# Patient Record
Sex: Female | Born: 1975 | Race: White | Hispanic: No | Marital: Single | Smoking: Current every day smoker
Health system: Southern US, Community
[De-identification: ages and names within clinical notes are randomized; demographics above are authoritative.]

## PROBLEM LIST (undated history)

## (undated) DIAGNOSIS — Z789 Other specified health status: Secondary | ICD-10-CM

## (undated) HISTORY — PX: TUBAL LIGATION: SHX77

---

## 1998-04-16 ENCOUNTER — Emergency Department (HOSPITAL_COMMUNITY): Admission: EM | Admit: 1998-04-16 | Discharge: 1998-04-16 | Payer: Self-pay | Admitting: Emergency Medicine

## 1999-06-14 ENCOUNTER — Inpatient Hospital Stay (HOSPITAL_COMMUNITY): Admission: AD | Admit: 1999-06-14 | Discharge: 1999-06-14 | Payer: Self-pay | Admitting: Obstetrics & Gynecology

## 1999-06-14 ENCOUNTER — Encounter (INDEPENDENT_AMBULATORY_CARE_PROVIDER_SITE_OTHER): Payer: Self-pay

## 2000-07-02 ENCOUNTER — Ambulatory Visit (HOSPITAL_COMMUNITY): Admission: RE | Admit: 2000-07-02 | Discharge: 2000-07-02 | Payer: Self-pay | Admitting: *Deleted

## 2000-07-24 ENCOUNTER — Encounter: Admission: RE | Admit: 2000-07-24 | Discharge: 2000-07-24 | Payer: Self-pay | Admitting: Obstetrics

## 2000-08-12 ENCOUNTER — Encounter: Payer: Self-pay | Admitting: *Deleted

## 2000-08-12 ENCOUNTER — Encounter (HOSPITAL_COMMUNITY): Admission: RE | Admit: 2000-08-12 | Discharge: 2000-09-11 | Payer: Self-pay | Admitting: *Deleted

## 2000-08-13 ENCOUNTER — Encounter: Admission: RE | Admit: 2000-08-13 | Discharge: 2000-08-13 | Payer: Self-pay | Admitting: Obstetrics & Gynecology

## 2000-09-10 ENCOUNTER — Inpatient Hospital Stay (HOSPITAL_COMMUNITY): Admission: AD | Admit: 2000-09-10 | Discharge: 2000-09-13 | Payer: Self-pay | Admitting: *Deleted

## 2000-09-10 ENCOUNTER — Encounter (INDEPENDENT_AMBULATORY_CARE_PROVIDER_SITE_OTHER): Payer: Self-pay

## 2000-10-14 ENCOUNTER — Encounter: Admission: RE | Admit: 2000-10-14 | Discharge: 2000-10-14 | Payer: Self-pay | Admitting: Sports Medicine

## 2000-10-14 ENCOUNTER — Inpatient Hospital Stay (HOSPITAL_COMMUNITY): Admission: EM | Admit: 2000-10-14 | Discharge: 2000-10-17 | Payer: Self-pay | Admitting: *Deleted

## 2000-10-31 ENCOUNTER — Encounter: Admission: RE | Admit: 2000-10-31 | Discharge: 2000-10-31 | Payer: Self-pay | Admitting: Family Medicine

## 2000-11-18 ENCOUNTER — Encounter: Admission: RE | Admit: 2000-11-18 | Discharge: 2000-11-18 | Payer: Self-pay | Admitting: Family Medicine

## 2001-02-24 ENCOUNTER — Inpatient Hospital Stay (HOSPITAL_COMMUNITY): Admission: AD | Admit: 2001-02-24 | Discharge: 2001-02-24 | Payer: Self-pay | Admitting: Obstetrics

## 2001-02-24 ENCOUNTER — Encounter: Payer: Self-pay | Admitting: Obstetrics

## 2001-06-13 ENCOUNTER — Emergency Department (HOSPITAL_COMMUNITY): Admission: EM | Admit: 2001-06-13 | Discharge: 2001-06-14 | Payer: Self-pay | Admitting: Emergency Medicine

## 2001-07-22 ENCOUNTER — Other Ambulatory Visit: Admission: RE | Admit: 2001-07-22 | Discharge: 2001-07-22 | Payer: Self-pay | Admitting: *Deleted

## 2001-07-23 ENCOUNTER — Encounter: Admission: RE | Admit: 2001-07-23 | Discharge: 2001-07-23 | Payer: Self-pay | Admitting: Family Medicine

## 2001-07-23 ENCOUNTER — Encounter (INDEPENDENT_AMBULATORY_CARE_PROVIDER_SITE_OTHER): Payer: Self-pay | Admitting: *Deleted

## 2001-07-23 ENCOUNTER — Other Ambulatory Visit: Admission: RE | Admit: 2001-07-23 | Discharge: 2001-07-23 | Payer: Self-pay | Admitting: Obstetrics

## 2001-10-20 ENCOUNTER — Encounter: Admission: RE | Admit: 2001-10-20 | Discharge: 2001-10-20 | Payer: Self-pay | Admitting: Family Medicine

## 2002-11-11 ENCOUNTER — Emergency Department (HOSPITAL_COMMUNITY): Admission: EM | Admit: 2002-11-11 | Discharge: 2002-11-11 | Payer: Self-pay | Admitting: *Deleted

## 2003-11-17 ENCOUNTER — Emergency Department (HOSPITAL_COMMUNITY): Admission: AD | Admit: 2003-11-17 | Discharge: 2003-11-17 | Payer: Self-pay | Admitting: Emergency Medicine

## 2005-04-15 ENCOUNTER — Ambulatory Visit (HOSPITAL_COMMUNITY): Admission: RE | Admit: 2005-04-15 | Discharge: 2005-04-15 | Payer: Self-pay | Admitting: Obstetrics and Gynecology

## 2005-04-26 ENCOUNTER — Encounter (HOSPITAL_COMMUNITY): Admission: AD | Admit: 2005-04-26 | Discharge: 2005-05-26 | Payer: Self-pay | Admitting: Obstetrics and Gynecology

## 2005-05-03 ENCOUNTER — Inpatient Hospital Stay (HOSPITAL_COMMUNITY): Admission: AD | Admit: 2005-05-03 | Discharge: 2005-05-03 | Payer: Self-pay | Admitting: *Deleted

## 2005-07-11 ENCOUNTER — Inpatient Hospital Stay (HOSPITAL_COMMUNITY): Admission: AD | Admit: 2005-07-11 | Discharge: 2005-07-11 | Payer: Self-pay

## 2005-07-12 ENCOUNTER — Inpatient Hospital Stay (HOSPITAL_COMMUNITY): Admission: AD | Admit: 2005-07-12 | Discharge: 2005-07-14 | Payer: Self-pay | Admitting: Obstetrics and Gynecology

## 2005-08-22 ENCOUNTER — Other Ambulatory Visit: Admission: RE | Admit: 2005-08-22 | Discharge: 2005-08-22 | Payer: Self-pay | Admitting: Obstetrics and Gynecology

## 2005-10-01 ENCOUNTER — Ambulatory Visit (HOSPITAL_COMMUNITY): Admission: RE | Admit: 2005-10-01 | Discharge: 2005-10-01 | Payer: Self-pay | Admitting: Obstetrics and Gynecology

## 2007-11-05 ENCOUNTER — Emergency Department (HOSPITAL_COMMUNITY): Admission: EM | Admit: 2007-11-05 | Discharge: 2007-11-05 | Payer: Self-pay | Admitting: Emergency Medicine

## 2007-11-08 ENCOUNTER — Emergency Department (HOSPITAL_COMMUNITY): Admission: EM | Admit: 2007-11-08 | Discharge: 2007-11-08 | Payer: Self-pay | Admitting: *Deleted

## 2007-12-07 ENCOUNTER — Emergency Department (HOSPITAL_COMMUNITY): Admission: EM | Admit: 2007-12-07 | Discharge: 2007-12-07 | Payer: Self-pay | Admitting: Emergency Medicine

## 2008-01-02 ENCOUNTER — Emergency Department (HOSPITAL_COMMUNITY): Admission: EM | Admit: 2008-01-02 | Discharge: 2008-01-02 | Payer: Self-pay | Admitting: Emergency Medicine

## 2008-01-28 ENCOUNTER — Emergency Department (HOSPITAL_COMMUNITY): Admission: EM | Admit: 2008-01-28 | Discharge: 2008-01-28 | Payer: Self-pay | Admitting: Emergency Medicine

## 2008-03-28 ENCOUNTER — Emergency Department (HOSPITAL_COMMUNITY): Admission: EM | Admit: 2008-03-28 | Discharge: 2008-03-29 | Payer: Self-pay | Admitting: Emergency Medicine

## 2008-05-03 ENCOUNTER — Emergency Department (HOSPITAL_COMMUNITY): Admission: EM | Admit: 2008-05-03 | Discharge: 2008-05-03 | Payer: Self-pay | Admitting: Emergency Medicine

## 2010-01-17 ENCOUNTER — Emergency Department (HOSPITAL_COMMUNITY): Admission: EM | Admit: 2010-01-17 | Discharge: 2010-01-17 | Payer: Self-pay | Admitting: Emergency Medicine

## 2010-04-10 ENCOUNTER — Emergency Department (HOSPITAL_COMMUNITY): Admission: EM | Admit: 2010-04-10 | Discharge: 2010-04-10 | Payer: Self-pay | Admitting: Emergency Medicine

## 2011-04-19 NOTE — Discharge Summary (Signed)
NAMELAFONDA, PATRON          ACCOUNT NO.:  [id number]   MEDICAL RECORD NO.:  [id number]          PATIENT TYPE:  INP   LOCATION:  9134                          FACILITY:  [facility]   PHYSICIAN:  Zenaida Niece, M.D.DATE OF BIRTH:  [birth year]   DATE OF ADMISSION:  07/12/2005  DATE OF DISCHARGE:  07/14/2005                                 DISCHARGE SUMMARY   ADMISSION DIAGNOSIS:  Intrauterine pregnancy at 38 weeks and meconium-  stained amniotic fluid.   DISCHARGE DIAGNOSIS:  Intrauterine pregnancy at 38 weeks and meconium-  stained fluid.   PROCEDURE:  On July 12, 2005, she had a spontaneous vaginal delivery.   HISTORY AND PHYSICAL:  This is a 35 year old white female gravida 6, para 1-  0-4-1 with an EGA of [redacted] weeks who presents with complaint of regular  contractions. Cervical exam on August10, 2006 was 4, 60%, and -1 and on the  day of admission in the office she was 6, 80% and -1. 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 Prenatal labs blood type is O  negative with negative antibody screen, RPR nonreactive, rubella equivocal,  hepatitis B surface antigen negative, HIV negative, Gonorrhea and chlamydia  negative, triple screen is normal, 1-hour Glucola 136, group B strep is  negative.   PAST OB HISTORY:  Three elective terminations, one spontaneous abortion, and  in 2001 vaginal delivery at 39 weeks, 7 pounds, no complications.   PAST MEDICAL HISTORY:  History of bipolar disorder.   PHYSICAL EXAMINATION:  VITAL SIGNS:  She is afebrile with stable vital  signs. Fetal heart tracing reactive with contractions every 45 minutes.  ABDOMEN:  Gravid, nontender with an estimated fetal weight 7-1/2 pounds.  PELVIC:  Cervix was in the hospital was 6-7, 80%, -1 vertex presentation and  amniotomy reveals thick meconium.   HOSPITAL COURSE:  The patient was admitted and she continued to  labor on her  own. Amniotomy was performed for augmentation. She received an epidural. She  progressed to complete, pushed well and on the evening of August11, 2006 had  a vaginal delivery of a viable female infant with Apgar's of 8 and 9, weight 6  pounds 15 ounces in the ROA position. DeLee and bulb suction were performed  on the perineum with moderate to thick meconium. Dr. Mikle Bosworth and the NICU  team were at delivery. The baby did cry spontaneously. Placenta delivered  spontaneously and was intact. Perineum was intact and estimated blood loss  was less than 500 cc. Postpartum, the patient had no significant  complications. Pre delivery hemoglobin 12.3, postdelivery 10.4. On  postpartum day #2, she was stable for discharge home.   DISCHARGE INSTRUCTIONS:  Regular diet, pelvic rest. Follow up in 4 weeks to  arrange tubal ligation.   MEDICATIONS:  1.  Percocet #20 one to two p.o. q.4-6h. p.r.n. pain.  2.  Over-the-counter ibuprofen as needed. She is also given our discharge      pamphlet.      Zenaida Niece, M.D.  Electronically Signed  TDM/MEDQ  D:  07/14/2005  T:  07/14/2005  Job:  [id number]

## 2011-04-19 NOTE — H&P (Signed)
[name], [name]          ACCOUNT NO.:  [id number]   MEDICAL RECORD NO.:  [id number]          PATIENT TYPE:  AMB   LOCATION:  SDC                           [facility]ACILITY:  [facility]   PHYSICIAN:  Zenaida Niece, M.D.DATE OF BIRTH:  Apr 23, 1976   DATE OF ADMISSION:  10/01/2005  DATE OF DISCHARGE:                                HISTORY & PHYSICAL   CHIEF COMPLAINT:  Desires surgical sterility.   HISTORY OF PRESENT ILLNESS:  This is a 35 year old white female, now para 2-  0-4-2, who is status post a vaginal delivery on July 12, 2005.  She had an  uncomplicated course after pregnancy, and does wish to proceed with surgical  sterilization.  All options and risks have been discussed with the patient,  and she wishes to proceed.   PAST OBSTETRIC HISTORY:  Significant for 3 elective terminations, 1  spontaneous abortion, and 2 vaginal deliveries at term without  complications.   PAST MEDICAL HISTORY:  History of bipolar disorder.   PAST SURGICAL HISTORY:  Negative.   ALLERGIES:  None.   CURRENT MEDICATIONS:  None.   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   FAMILY HISTORY:  Noncontributory.   REVIEW OF SYSTEMS:  Negative.   PHYSICAL EXAMINATION:  GENERAL:  This is a well-developed white female in no  acute distress.  VITAL SIGNS:  Weight is approximately 152 pounds.  NECK:  Supple without lymphadenopathy or thyromegaly.  LUNGS:  Clear to auscultation.  HEART:  Regular rate and rhythm without murmur.  ABDOMEN:  Soft, nontender, nondistended, without palpable masses.  BREASTS:  She did have a 1 cm nodule below the left areola at 9 o'clock, and  she has seen Dr. Lindie Spruce for this.  EXTREMITIES:  No edema and are nontender.  PELVIC:  External genitalia is within normal limits.  Speculum exam reveals  a normal cervix, and on colposcopy she had acetowhite changes anteriorly.  Bimanual exam reveals a small midplane nontender uterus  without palpable  masses.   ASSESSMENT:  Desires surgical sterility.  Again, all options and risks have  been discussed with the patient.  She understands this is a permanent  procedure with a 1 in 150 failure rate.  She understands there are other  options.  She  understands the risks of surgery including bleeding, infection, and damage  to surrounding organs including the bowel, the bladder, and the ureters.  She understands these risks and wishes to proceed.   PLAN:  The plan is to admit the patient on the day of surgery with  laparoscopy with bilateral tubal fulguration.      Zenaida Niece, M.D.  Electronically Signed     TDM/MEDQ  D:  09/30/2005  T:  09/30/2005  Job:  [id number]
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
## 2011-04-19 NOTE — Op Note (Signed)
[name], [name]          ACCOUNT NO.:  [id number]   MEDICAL RECORD NO.:  [id number]          PATIENT TYPE:  AMB   LOCATION:  SDC                           FACILITY:  [facility]   PHYSICIAN:  Zenaida Niece, M.D.DATE OF BIRTH:  1976-10-21   DATE OF PROCEDURE:  10/01/2005  DATE OF DISCHARGE:                                 OPERATIVE REPORT   PREOPERATIVE DIAGNOSIS:  Desires surgical sterility.   POSTOPERATIVE DIAGNOSES:  1.  Desires surgical sterility.  2.  Pelvic adhesions.   PROCEDURE:  Laparoscopy with bilateral tubal fulguration.   SURGEON:  Zenaida Niece, M.D.   ANESTHESIA:  General endotracheal tube.   SPECIMENS:  None.   ESTIMATED BLOOD LOSS:  Minimal.   COMPLICATIONS:  None.   FINDINGS:  Normal uterus, tubes and ovaries.  There were some adhesions of  the sigmoid to the left pelvis.   PROCEDURE IN DETAIL:  The patient was taken to the operating room and placed  in the dorsal supine position.  General anesthesia was induced and she was  placed in mobile stirrups.  Abdomen was prepped and draped in the usual  sterile fashion, bladder drained with a red rubber catheter, Hulka tenaculum  applied to the cervix for uterine manipulation.  Infraumbilical skin was  then infiltrated with 0.25% Marcaine and a 1.5 cm horizontal incision was  made.  The Veress needle was inserted into the peritoneal cavity and  placement confirmed by the water drop test and an opening pressure of 3  mmHg.  CO2 gas was insufflated to a pressure of 12 mmHg and the Veress  needle was removed.  A size 11 disposable trocar was then introduced and  placement confirmed by the laparoscope.  Inspection of the abdomen revealed  a normal liver, normal stomach and normal bowel.  The uterus, tubes and  ovaries appeared normal.  There were sigmoid adhesions to the left pelvis, and some of these were  taken down bluntly.  Both fallopian tubes in all spots.  This achieved good  fulguration with  adequate hemostasis.  The laparoscope was then removed and  all gas allowed to deflate from the abdomen.  The trocar was then removed.  The skin was closed with interrupted  subcuticular sutures of 3-0 Vicryl followed by Dermabond.  The Hulka  tenaculum was then removed from the cervix.  The patient was awakened in the  operating room after tolerating the procedure well and was taken to the  recovery room in stable condition.  At the end, counts were correct x2 and  she received no antibiotics.      Zenaida Niece, M.D.  Electronically Signed     TDM/MEDQ  D:  10/01/2005  T:  10/01/2005  Job:  [id number]

## 2011-04-19 NOTE — Discharge Summary (Signed)
[facility]  Patient:    [name], [name]                 MRN: [id number] Adm. Date:  [id number] Disc. Date: [id number] Attending:  Denny Peon                           Discharge Summary  INTRODUCTION:  [name] is a 35 year old white single female mother of a one-month-old daughter.  The patient was admitted on a voluntary basis after developing suicidal thoughts with plan to cut her wrists.  For several weeks she could not sleep, felt depressed hopeless, helpless and she believes and has a good reason to believe it, the boyfriend, who is the father of her baby, cheats on her.  The patient complained of tiredness, anhedonia and irritability.  She denied hallucinations, denied homicidal thoughts, but complained of having racing thoughts.  The patient does not have a previous history of depression.  The patient did not have any medical problems at the time of admission but had some vaginal bleeding, which could be the beginning of her first period.  She had one week ago unprotected intercourse with her boyfriend but denied feeling pregnant.  Details of patients history are available on the chart.  Initial impression was major depression, single episode, moderate with no diagnosis on axes II or III.  HOSPITAL COURSE:  After admission to the ward, the patient was placed on special observation.  I ordered blood work to recheck pregnancy test.  I started the patient on Celexa 20 mg daily and Ativan 0.25 mg as needed for anxiety, trazodone 25 mg for insomnia.  On October 16, 2000, the patient was doing much better, denied suicidal thoughts and slept better with trazodone.  Her affect was improved, brighter, Judgment was still questionable.  I asked her to establish some contact with family and boyfriend in order to prepare patient for discharge.  The patients boyfriend did not attend session on October 16, 2000 but boyfriend cancelled  session due to oversleeping.  He cancelled session on October 17, 2000 because his car broke down.  The patient stated that nevertheless she wanted to be discharged.  She denied any suicidal ideation.  During the examination she presented with bright affect, strongly denied suicidal thoughts or thoughts to hurt her boyfriend.  She felt that group attendance and medication helped her.  The patient was still hoping that she can accomplish some relationship with her boyfriend on an outpatient basis and felt she got strong enough to deal with her current difficulties.  Caseworker, Tereasa Coop, was able to talk to boyfriend who felt his girlfriend, after telephone contact with her, was doing better and was ready for discharge.  The patients boyfriend agreed to couple counseling and it was also favorably seen by the patient.  It was a team feeling, shared by other psychiatrists that the patient was ready for discharge and was discharged home in the care of her boyfriend.  MEDICAL PROBLEMS:  During the hospital course the patient did not have any ongoing medical problems.  Vital signs were stable. The patient was afebrile with normal blood pressure.  The patient did not cooperate with order for blood work, feeling she had blood work done at the time of her childbirth and delivery.  DISCHARGE DIAGNOSES: Axis I:    Major depression, single episode, moderate. Axis II:   No diagnosis. Axis III:  Status postpartum one month.  Axis IV:   Moderate stressor, problem with boyfriend. Axis V:    Global assessment of functioning upon admission 30, maximum for            past year 70, upon discharge 55-60.  Prior to admission the patient already started her own birth control pills started by her gynecologist.  This was another reason she did not cooperate with testing, telling it was impossible she could be pregnant.  DISCHARGE RECOMMENDATIONS: 1. The patient received prescription for Celexa to  take 20 mg daily, trazodone    50 mg to take 1.2 tablet as needed for insomnia. 2. She is to call mental health for recurrence of suicidal thoughts. 3. She should attend with her boyfriend couple counseling at [facility].  She has an appointment at [facility] with Dr. Harle Stanford on October 29, 2000 at 1:30 p.m. 4. The patient understood instructions and side effects of medication were    explained to her. DD:  11/18/00 TD:  11/19/00 Job: [id number]

## 2011-08-21 LAB — URINALYSIS, ROUTINE W REFLEX MICROSCOPIC
Ketones, ur: NEGATIVE
Nitrite: NEGATIVE
Protein, ur: NEGATIVE
Specific Gravity, Urine: 1.026
Urobilinogen, UA: 0.2

## 2011-08-21 LAB — DIFFERENTIAL
Basophils Relative: 0
Eosinophils Absolute: 0.3
Eosinophils Relative: 3
Monocytes Relative: 8

## 2011-08-21 LAB — I-STAT 8, (EC8 V) (CONVERTED LAB)
Bicarbonate: 23.1
Hemoglobin: 15.6 — ABNORMAL HIGH
TCO2: 24
pCO2, Ven: 33.9 — ABNORMAL LOW
pH, Ven: 7.441 — ABNORMAL HIGH

## 2011-08-21 LAB — URINE MICROSCOPIC-ADD ON

## 2011-08-21 LAB — POCT I-STAT CREATININE
Creatinine, Ser: 0.9
Operator id: 288831

## 2011-08-21 LAB — CBC
HCT: 43
Hemoglobin: 14.6
MCHC: 34
MCV: 85.5
Platelets: 253
RDW: 12.9

## 2011-08-27 LAB — CBC
Hemoglobin: 13.2
MCHC: 34.9
MCV: 86.2
RBC: 4.38
RDW: 12.6

## 2011-08-27 LAB — BASIC METABOLIC PANEL
BUN: 11
Creatinine, Ser: 0.81
GFR calc Af Amer: >60
GFR calc non Af Amer: >60
Glucose, Bld: 99
Potassium: 3.3 — ABNORMAL LOW
Sodium: 138

## 2011-08-27 LAB — DIFFERENTIAL
Band Neutrophils: 0
Blasts: 0
Eosinophils Absolute: 0.1
Lymphocytes Relative: 27
Metamyelocytes Relative: 0
Monocytes Relative: 6
Myelocytes: 0
Neutro Abs: 7.4
nRBC: 0

## 2011-08-27 LAB — URINE MICROSCOPIC-ADD ON

## 2011-08-27 LAB — URINALYSIS, ROUTINE W REFLEX MICROSCOPIC
Glucose, UA: NEGATIVE
Protein, ur: 30 — AB
Specific Gravity, Urine: 1.033 — ABNORMAL HIGH
Urobilinogen, UA: 1

## 2011-08-27 LAB — GC/CHLAMYDIA PROBE AMP, GENITAL
Chlamydia, DNA Probe: NEGATIVE
GC Probe Amp, Genital: NEGATIVE

## 2011-08-27 LAB — WET PREP, GENITAL
Trich, Wet Prep: NONE SEEN
Yeast Wet Prep HPF POC: NONE SEEN

## 2013-05-21 ENCOUNTER — Encounter (HOSPITAL_COMMUNITY): Payer: Self-pay | Admitting: *Deleted

## 2013-05-21 ENCOUNTER — Emergency Department (INDEPENDENT_AMBULATORY_CARE_PROVIDER_SITE_OTHER)
Admission: EM | Admit: 2013-05-21 | Discharge: 2013-05-21 | Disposition: A | Discharge status: Home / Self Care | Payer: Self-pay | Source: Home / Self Care

## 2013-05-21 DIAGNOSIS — IMO0002 Reserved for concepts with insufficient information to code with codable children: Secondary | ICD-10-CM

## 2013-05-21 DIAGNOSIS — N390 Urinary tract infection, site not specified: Secondary | ICD-10-CM

## 2013-05-21 DIAGNOSIS — M545 Low back pain: Secondary | ICD-10-CM

## 2013-05-21 DIAGNOSIS — S76219A Strain of adductor muscle, fascia and tendon of unspecified thigh, initial encounter: Secondary | ICD-10-CM

## 2013-05-21 DIAGNOSIS — M549 Dorsalgia, unspecified: Secondary | ICD-10-CM

## 2013-05-21 LAB — POCT URINALYSIS DIP (DEVICE)
Protein, ur: >=300 mg/dL — AB
Urobilinogen, UA: 1 mg/dL (ref 0.0–1.0)
pH: 5.5 (ref 5.0–8.0)

## 2013-05-21 MED ORDER — CEPHALEXIN 250 MG PO CAPS: 250.0000 mg | ORAL_CAPSULE | Freq: Four times a day (QID) | ORAL | Status: DC

## 2013-05-21 NOTE — ED Provider Notes (Signed)
History     CSN: [id number]  Arrival date & time 05/21/13  1046   None     Chief Complaint  Patient presents with  . Urinary Frequency    (Consider location/radiation/quality/duration/timing/severity/associated sxs/prior treatment) HPI Comments: 37 year old female complaining of pain in the left low back radiating to the left inguinal pus was 2 days ago. She is also complaining of urinary frequency and dysuria. She states that after she has a full full weight and she experiences pain in the left inguinal/lower pelvis it radiates into the left low back and into the thoracic or lumbar musculature. The pain is worse with movement such as bending, twisting, pulling, turning. Her job entails frequent walking sometimes rapid walking at her job for several hours during the day. Otherwise she does not exercise or use machinery for exercise purposes.   History reviewed. No pertinent past medical history.  History reviewed. No pertinent past surgical history.  History reviewed. No pertinent family history.  History  Substance Use Topics  . Smoking status: Current Every Day Smoker  . Smokeless tobacco: Not on file  . Alcohol Use: Yes    OB History   Grav Para Term Preterm Abortions TAB SAB Ect Mult Living                  Review of Systems  Constitutional: Negative.   HENT: Negative.   Respiratory: Negative.   Cardiovascular: Negative.   Gastrointestinal: Negative for nausea, vomiting, abdominal pain and blood in stool.  Genitourinary: Positive for dysuria, urgency, frequency, flank pain and vaginal bleeding. Negative for decreased urine volume, vaginal discharge and vaginal pain.  Musculoskeletal: Positive for myalgias and back pain.  Skin: Negative.   Neurological: Negative.   Hematological: Does not bruise/bleed easily.  Psychiatric/Behavioral: The patient is nervous/anxious.     Allergies  Review of patient's allergies indicates no known allergies.  Home Medications    Current Outpatient Rx  Name  Route  Sig  Dispense  Refill  . cephALEXin (KEFLEX) 250 MG capsule   Oral   Take 1 capsule (250 mg total) by mouth 4 (four) times daily.   28 capsule   0     BP 122/80  Pulse 72  Temp(Src) 99.2 F (37.3 C) (Oral)  Resp 16  SpO2 100%  LMP 05/21/2013  Physical Exam  Nursing note and vitals reviewed. Constitutional: She is oriented to person, place, and time. She appears well-developed and well-nourished. No distress.  Eyes: Conjunctivae and EOM are normal.  Neck: Normal range of motion. Neck supple.  Cardiovascular: Normal rate, regular rhythm and normal heart sounds.   Pulmonary/Chest: Effort normal and breath sounds normal.  Abdominal: Soft. She exhibits no distension and no mass. There is no rebound and no guarding.  Light, left lateral abdominal palpation elicits a pain response. There is tenderness along the abdominal wall into the left inguinal. Straight leg raises on the left reproduce the pain in the inguinal and lower pelvis/abdomen. Palpation of musculature and tendons that contract during this maneuver becomes easily palpable and produces pain with palpation. Mild tenderness over the suprapubic area.  Musculoskeletal: She exhibits no edema.  As above palpation of the left inguinal muscle and tendons are tender. Palpation of the muscles to the left the right lumbar area tender to light and moderate touch. Having her flex forward and then arrives to an erect position produces pain in this area. Is also tenderness in the upper left buttock muscle/gluteus medius and maximus. She states his  reproduces the pain for which she presents.  Neurological: She is alert and oriented to person, place, and time. No cranial nerve deficit. Coordination normal.  Skin: Skin is warm and dry. No rash noted.  Psychiatric: Her mood appears anxious.    ED Course  Procedures (including critical care time)  Labs Reviewed  POCT URINALYSIS DIP (DEVICE) - Abnormal;  Notable for the following:    Bilirubin Urine SMALL (*)    Ketones, ur TRACE (*)    Hgb urine dipstick LARGE (*)    Protein, ur >=300 (*)    Leukocytes, UA TRACE (*)    All other components within normal limits  POCT PREGNANCY, URINE   No results found.   1. Thoracolumbar back pain   2. Groin strain, initial encounter   3. UTI (lower urinary tract infection)       MDM  Apply heat to the area of pain in the groin and left back. No lifting, bending, stooping, pulling or excessive walking. Out of work today and tomorrow. May take OTC ibuprofen as directed for pain relief. Keflex 250 mg 4 times a day for 7 days. Drink plenty of fluids especially water and stay well-hydrated. For any new symptoms problems or worsening may return. The patient was unable to offer a urine specimen sufficient enough to obtain a urine culture as she voided just prior to coming to the urgent care. She is currently having her menses.        Hayden Rasmussen, NP 05/21/13 1135

## 2013-05-21 NOTE — ED Notes (Signed)
Pt  Reports   Frequency  Urination            With    Pain  When  She  Finishes    She  Also reports   Pain l  Side  Radiating to l  Lower  Quadrant        Symptoms  X  2  Days        Pt  Reports  She  Just started  Her  Period  Today

## 2013-05-21 NOTE — ED Provider Notes (Signed)
Medical screening examination/treatment/procedure(s) were performed by resident physician or non-physician practitioner and as supervising physician I was immediately available for consultation/collaboration.   KINDL,JAMES DOUGLAS MD.   James D Kindl, MD 05/21/13 1232 

## 2014-01-07 ENCOUNTER — Encounter (HOSPITAL_COMMUNITY): Payer: Self-pay | Admitting: Emergency Medicine

## 2014-01-07 ENCOUNTER — Emergency Department (HOSPITAL_COMMUNITY)
Admission: EM | Admit: 2014-01-07 | Discharge: 2014-01-07 | Disposition: A | Discharge status: Home / Self Care | Payer: Self-pay | Attending: Emergency Medicine | Admitting: Emergency Medicine

## 2014-01-07 DIAGNOSIS — N6011 Diffuse cystic mastopathy of right breast: Secondary | ICD-10-CM

## 2014-01-07 DIAGNOSIS — N6012 Diffuse cystic mastopathy of left breast: Secondary | ICD-10-CM

## 2014-01-07 DIAGNOSIS — N6019 Diffuse cystic mastopathy of unspecified breast: Secondary | ICD-10-CM | POA: Insufficient documentation

## 2014-01-07 DIAGNOSIS — J029 Acute pharyngitis, unspecified: Secondary | ICD-10-CM

## 2014-01-07 DIAGNOSIS — J069 Acute upper respiratory infection, unspecified: Secondary | ICD-10-CM | POA: Insufficient documentation

## 2014-01-07 DIAGNOSIS — F17200 Nicotine dependence, unspecified, uncomplicated: Secondary | ICD-10-CM | POA: Insufficient documentation

## 2014-01-07 LAB — RAPID STREP SCREEN (MED CTR MEBANE ONLY): Streptococcus, Group A Screen (Direct): NEGATIVE

## 2014-01-07 MED ORDER — IBUPROFEN 400 MG PO TABS
800.0000 mg | ORAL_TABLET | Freq: Once | ORAL | Status: AC
  Administered 2014-01-07: 800 mg via ORAL
  Filled 2014-01-07: qty 2

## 2014-01-07 NOTE — ED Provider Notes (Signed)
CSN: [id number]     Arrival date & time 01/07/14  1505 History  This chart was scribed for [name], non-physician practitioner, working with Darlys Galesavid Masneri, MD by Bennett Scrapehristina Taylor, ED Scribe. This patient was seen in room TR10C/TR10C and the patient's care was started at 3:20 PM.    Chief Complaint  Patient presents with  . Sore Throat    The history is provided by the patient. No language interpreter was used.   HPI Comments: [name] is a 38 y.o. female who presents to the Emergency Department complaining of sore throat for the past couple of days. The pain is described as burning that is worse with drinking and eating. She reports an associated fever of 101 this morning. Temperature is 97.6 in the ED. She has taken Robitussin for the symptoms with improvement in the fever only. She reports several sick contacts at home with similar symptoms and she admits to having prior diagnoses of strep throat. She has a chronic smoker's cough but denies any changes.  She has a secondary complaint of a "knot" to her left breast that she noticed three days ago. She reports feeling a sharp pain when touching the area. She is currently on her menses which started this morning. She denies any recent heavy lifting.   History reviewed. No pertinent past medical history. History reviewed. No pertinent past surgical history. No family history on file. History  Substance Use Topics  . Smoking status: Current Every Day Smoker  . Smokeless tobacco: Not on file  . Alcohol Use: Yes  No OB history provided.  Review of Systems  A complete 10 system review of systems was obtained and all systems are negative except as noted in the HPI and PMH.   Allergies  Review of patient's allergies indicates no known allergies.  Home Medications   Current Outpatient Rx  Name  Route  Sig  Dispense  Refill  . guaiFENesin (ROBITUSSIN) 100 MG/5ML SOLN   Oral   Take 10 mLs by mouth daily as needed for cough  or to loosen phlegm.          Triage Vitals: BP 164/92  Pulse 74  Temp(Src) 97.6 F (36.4 C) (Oral)  Resp 16  SpO2 98%  Physical Exam  Nursing note and vitals reviewed. Constitutional: She is oriented to person, place, and time. She appears well-developed and well-nourished. No distress.  HENT:  Head: Normocephalic and atraumatic.  Enlarged and inflamed tonsils bilaterally+2 without exudate, mucosal edema  Eyes: EOM are normal.  Neck: Neck supple. No tracheal deviation present.  Cardiovascular: Normal rate.   Pulmonary/Chest: Effort normal. No respiratory distress.  1.5 cm tender, slightly raised, mobile mass to bilateral breasts around 12 o'clock, chaperone (scribe) present  Musculoskeletal: Normal range of motion.  Neurological: She is alert and oriented to person, place, and time.  Skin: Skin is warm and dry.  Psychiatric: She has a normal mood and affect. Her behavior is normal.    ED Course  Procedures (including critical care time)  DIAGNOSTIC STUDIES: Oxygen Saturation is 98% on RA, normal by my interpretation.    COORDINATION OF CARE: 3:26 PM-Informed pt that the "bumps" are most likely changes that come with menses. Discussed discharge plan which includes IBU for the breast pain. Will check a strep test.   Labs Review Labs Reviewed  RAPID STREP SCREEN  CULTURE, GROUP A STREP   Imaging Review No results found.  EKG Interpretation   None  MDM   1. Pharyngitis   2. Fibrocystic breast changes of both breasts   3. URI (upper respiratory infection)    Pt presenting with URI s/s, she is well appearing, NAD, afebrile, VSS. Rapid strep negative. Discussed fibrocystic changes in detail with pt, she will f/u with ob/gyn if no improvement. Advised ibuprofen. Symptomatic tx discussed. Stable for discharge. Return precautions given. Patient states understanding of treatment care plan and is agreeable.   I personally performed the services described in this  documentation, which was scribed in my presence. The recorded information has been reviewed and is accurate.    Trevor Mace, PA-C 01/07/14 [phone]

## 2014-01-07 NOTE — Discharge Instructions (Signed)
Take ibuprofen every 6-8 hours for pain, 600-800 mg. Follow up with your ob/gyn if symptoms do not improve. Rest, stay well hydrated.   Pharyngitis Pharyngitis is redness, pain, and swelling (inflammation) of your pharynx.  CAUSES  Pharyngitis is usually caused by infection. Most of the time, these infections are from viruses (viral) and are part of a cold. However, sometimes pharyngitis is caused by bacteria (bacterial). Pharyngitis can also be caused by allergies. Viral pharyngitis may be spread from person to person by coughing, sneezing, and personal items or utensils (cups, forks, spoons, toothbrushes). Bacterial pharyngitis may be spread from person to person by more intimate contact, such as kissing.  SIGNS AND SYMPTOMS  Symptoms of pharyngitis include:   Sore throat.   Tiredness (fatigue).   Low-grade fever.   Headache.  Joint pain and muscle aches.  Skin rashes.  Swollen lymph nodes.  Plaque-like film on throat or tonsils (often seen with bacterial pharyngitis). DIAGNOSIS  Your health care provider will ask you questions about your illness and your symptoms. Your medical history, along with a physical exam, is often all that is needed to diagnose pharyngitis. Sometimes, a rapid strep test is done. Other lab tests may also be done, depending on the suspected cause.  TREATMENT  Viral pharyngitis will usually get better in 3 4 days without the use of medicine. Bacterial pharyngitis is treated with medicines that kill germs (antibiotics).  HOME CARE INSTRUCTIONS   Drink enough water and fluids to keep your urine clear or pale yellow.   Only take over-the-counter or prescription medicines as directed by your health care provider:   If you are prescribed antibiotics, make sure you finish them even if you start to feel better.   Do not take aspirin.   Get lots of rest.   Gargle with 8 oz of salt water ( tsp of salt per 1 qt of water) as often as every 1 2 hours to  soothe your throat.   Throat lozenges (if you are not at risk for choking) or sprays may be used to soothe your throat. SEEK MEDICAL CARE IF:   You have large, tender lumps in your neck.  You have a rash.  You cough up green, yellow-brown, or bloody spit. SEEK IMMEDIATE MEDICAL CARE IF:   Your neck becomes stiff.  You drool or are unable to swallow liquids.  You vomit or are unable to keep medicines or liquids down.  You have severe pain that does not go away with the use of recommended medicines.  You have trouble breathing (not caused by a stuffy nose). MAKE SURE YOU:   Understand these instructions.  Will watch your condition.  Will get help right away if you are not doing well or get worse. Document Released: 11/18/2005 Document Revised: 09/08/2013 Document Reviewed: 07/26/2013 [facility] Patient Information 2014 [place], [place].  Upper Respiratory Infection, Adult An upper respiratory infection (URI) is also sometimes known as the common cold. The upper respiratory tract includes the nose, sinuses, throat, trachea, and bronchi. Bronchi are the airways leading to the lungs. Most people improve within 1 week, but symptoms can last up to 2 weeks. A residual cough may last even longer.  CAUSES Many different viruses can infect the tissues lining the upper respiratory tract. The tissues become irritated and inflamed and often become very moist. Mucus production is also common. A cold is contagious. You can easily spread the virus to others by oral contact. This includes kissing, sharing a glass, coughing, or  sneezing. Touching your mouth or nose and then touching a surface, which is then touched by another person, can also spread the virus. SYMPTOMS  Symptoms typically develop 1 to 3 days after you come in contact with a cold virus. Symptoms vary from person to person. They may include:  Runny nose.  Sneezing.  Nasal congestion.  Sinus irritation.  Sore throat.  Loss of  voice (laryngitis).  Cough.  Fatigue.  Muscle aches.  Loss of appetite.  Headache.  Low-grade fever. DIAGNOSIS  You might diagnose your own cold based on familiar symptoms, since most people get a cold 2 to 3 times a year. Your caregiver can confirm this based on your exam. Most importantly, your caregiver can check that your symptoms are not due to another disease such as strep throat, sinusitis, pneumonia, asthma, or epiglottitis. Blood tests, throat tests, and X-rays are not necessary to diagnose a common cold, but they may sometimes be helpful in excluding other more serious diseases. Your caregiver will decide if any further tests are required. RISKS AND COMPLICATIONS  You may be at risk for a more severe case of the common cold if you smoke cigarettes, have chronic heart disease (such as heart failure) or lung disease (such as asthma), or if you have a weakened immune system. The very young and very old are also at risk for more serious infections. Bacterial sinusitis, middle ear infections, and bacterial pneumonia can complicate the common cold. The common cold can worsen asthma and chronic obstructive pulmonary disease (COPD). Sometimes, these complications can require emergency medical care and may be life-threatening. PREVENTION  The best way to protect against getting a cold is to practice good hygiene. Avoid oral or hand contact with people with cold symptoms. Wash your hands often if contact occurs. There is no clear evidence that vitamin C, vitamin E, echinacea, or exercise reduces the chance of developing a cold. However, it is always recommended to get plenty of rest and practice good nutrition. TREATMENT  Treatment is directed at relieving symptoms. There is no cure. Antibiotics are not effective, because the infection is caused by a virus, not by bacteria. Treatment may include:  Increased fluid intake. Sports drinks offer valuable electrolytes, sugars, and  fluids.  Breathing heated mist or steam (vaporizer or shower).  Eating chicken soup or other clear broths, and maintaining good nutrition.  Getting plenty of rest.  Using gargles or lozenges for comfort.  Controlling fevers with ibuprofen or acetaminophen as directed by your caregiver.  Increasing usage of your inhaler if you have asthma. Zinc gel and zinc lozenges, taken in the first 24 hours of the common cold, can shorten the duration and lessen the severity of symptoms. Pain medicines may help with fever, muscle aches, and throat pain. A variety of non-prescription medicines are available to treat congestion and runny nose. Your caregiver can make recommendations and may suggest nasal or lung inhalers for other symptoms.  HOME CARE INSTRUCTIONS   Only take over-the-counter or prescription medicines for pain, discomfort, or fever as directed by your caregiver.  Use a warm mist humidifier or inhale steam from a shower to increase air moisture. This may keep secretions moist and make it easier to breathe.  Drink enough water and fluids to keep your urine clear or pale yellow.  Rest as needed.  Return to work when your temperature has returned to normal or as your caregiver advises. You may need to stay home longer to avoid infecting others. You can also  use a face mask and careful hand washing to prevent spread of the virus. SEEK MEDICAL CARE IF:   After the first few days, you feel you are getting worse rather than better.  You need your caregiver's advice about medicines to control symptoms.  You develop chills, worsening shortness of breath, or brown or red sputum. These may be signs of pneumonia.  You develop yellow or brown nasal discharge or pain in the face, especially when you bend forward. These may be signs of sinusitis.  You develop a fever, swollen neck glands, pain with swallowing, or white areas in the back of your throat. These may be signs of strep throat. SEEK  IMMEDIATE MEDICAL CARE IF:   You have a fever.  You develop severe or persistent headache, ear pain, sinus pain, or chest pain.  You develop wheezing, a prolonged cough, cough up blood, or have a change in your usual mucus (if you have chronic lung disease).  You develop sore muscles or a stiff neck. Document Released: 05/14/2001 Document Revised: 02/10/2012 Document Reviewed: 03/22/2011 [facility] Patient Information 2014 [place], [place].

## 2014-01-07 NOTE — ED Notes (Signed)
Pt c/o sore throat, hurts to eat or drink. C/o knot in her chest, states it hurts to breathe in. Pt states everyone in her house is sick. Pt has tried robitussin. Pt states this morning her fever was 101. Robyn PA at bedside.

## 2014-01-07 NOTE — ED Notes (Signed)
Pt states she has a sore throat for a couple of days.  Pt describes sore throat as burning.  Pt also states she has a knot to her L upper chest.  Pt states the knot is sore.

## 2014-01-07 NOTE — ED Provider Notes (Signed)
Medical screening examination/treatment/procedure(s) were performed by non-physician practitioner and as supervising physician I was immediately available for consultation/collaboration.  EKG Interpretation   None         Darlys Galesavid Masneri, MD 01/07/14 2048

## 2014-01-09 LAB — CULTURE, GROUP A STREP

## 2014-08-05 ENCOUNTER — Emergency Department (HOSPITAL_COMMUNITY)
Admission: EM | Admit: 2014-08-05 | Discharge: 2014-08-05 | Disposition: A | Discharge status: Home / Self Care | Payer: Self-pay | Attending: Emergency Medicine | Admitting: Emergency Medicine

## 2014-08-05 ENCOUNTER — Encounter (HOSPITAL_COMMUNITY): Payer: Self-pay | Admitting: Emergency Medicine

## 2014-08-05 DIAGNOSIS — M25521 Pain in right elbow: Secondary | ICD-10-CM

## 2014-08-05 DIAGNOSIS — F17200 Nicotine dependence, unspecified, uncomplicated: Secondary | ICD-10-CM | POA: Insufficient documentation

## 2014-08-05 DIAGNOSIS — M79609 Pain in unspecified limb: Secondary | ICD-10-CM | POA: Insufficient documentation

## 2014-08-05 DIAGNOSIS — M25529 Pain in unspecified elbow: Secondary | ICD-10-CM | POA: Insufficient documentation

## 2014-08-05 MED ORDER — TRAMADOL HCL 50 MG PO TABS: 50.0000 mg | ORAL_TABLET | Freq: Four times a day (QID) | ORAL | Status: DC | PRN

## 2014-08-05 MED ORDER — NAPROXEN 500 MG PO TABS: 500.0000 mg | ORAL_TABLET | Freq: Two times a day (BID) | ORAL | Status: DC

## 2014-08-05 NOTE — ED Notes (Signed)
Pt reports for the last week here right hand has been locking up. Pt reports intermittent numbness and tingling in right arm and elbow. Pt with distal circulation intact. 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 Pt passive ROM noted. Pt awake, alert, oriented x4, NAD.

## 2014-08-05 NOTE — ED Provider Notes (Signed)
CSN: [id number]     Arrival date & time 08/05/14  1300 History   This chart was scribed for non-physician practitioner, Santiago Glad, PA-C, working with Mirian Mo, MD by Charline Bills, ED Scribe. This patient was seen in room TR07C/TR07C and the patient's care was started at 1:58 PM.    Chief Complaint  Patient presents with  . Arm Pain   The history is provided by the patient. No language interpreter was used.   HPI Comments: [name] is a 38 y.o. female who presents to the Emergency Department complaining of intermittent R elbow pain over the past week, worsened yesterday. She best describes the sensation as having a "charley horse" in her forearm. Pt currently rates the pain 6.5/10. Pain is exacerbated with applying pressure and extending her R arm. Pt reports episodes of her R elbow "locking up" before involuntarily flexing. She states that she is unable to move her fingers and reports associated numbness/tingling during these episodes. Pt is R hand dominant. She works as a Conservation officer, nature. Pt has applied a heating pad and taken ibuprofen with mild relief.    No past medical history on file. Past Surgical History  Procedure Laterality Date  . Tubal ligation     No family history on file. History  Substance Use Topics  . Smoking status: Current Every Day Smoker -- 0.50 packs/day    Types: Cigarettes  . Smokeless tobacco: Not on file  . Alcohol Use: Yes     Comment: 4/week   OB History   Grav Para Term Preterm Abortions TAB SAB Ect Mult Living                 Review of Systems  Musculoskeletal: Positive for arthralgias and myalgias.  Neurological: Positive for numbness.  All other systems reviewed and are negative.  Allergies  Review of patient's allergies indicates no known allergies.  Home Medications   Prior to Admission medications   Medication Sig Start Date End Date Taking? Authorizing Provider  guaiFENesin (ROBITUSSIN) 100 MG/5ML SOLN Take 10 mLs by  mouth daily as needed for cough or to loosen phlegm.    Historical Provider, MD   Triage Vitals: BP 107/70  Pulse 82  Temp(Src) 98 F (36.7 C) (Oral)  SpO2 99%  LMP 07/29/2014 Physical Exam  Nursing note and vitals reviewed. Constitutional: She is oriented to person, place, and time. She appears well-developed and well-nourished.  HENT:  Head: Normocephalic and atraumatic.  Eyes: Conjunctivae and EOM are normal.  Neck: Neck supple.  Cardiovascular: Normal rate, regular rhythm and normal heart sounds.   Pulses:      Radial pulses are 2+ on the right side, and 2+ on the left side.  Pulmonary/Chest: Effort normal and breath sounds normal.  Musculoskeletal: Normal range of motion.       Right elbow: She exhibits normal range of motion.  R arm: No erythema, edema or warmth of R elbow or wrist Full ROM of R elbow and R wrist  Neurological: She is alert and oriented to person, place, and time.  Distal sensation of R hand is intact Negative Tinel's sign on the R  Skin: Skin is warm and dry.  Psychiatric: She has a normal mood and affect. Her behavior is normal.   ED Course  Procedures (including critical care time) DIAGNOSTIC STUDIES: Oxygen Saturation is 99% on RA, normal by my interpretation.    COORDINATION OF CARE: 2:03 PM-Discussed treatment plan which includes medication for pain and referral to  specialist with pt at bedside and pt agreed to plan.   Labs Review Labs Reviewed - No data to display  Imaging Review No results found.   EKG Interpretation None      MDM   Final diagnoses:  None   Patient presenting today with elbow pain.  No signs of infection.  Signs and symptoms most consistent with tendonitis.  Patient neurovascularly intact.  Patient given sling and referral to Hand Surgery.  Patient stable for discharge.  Return precautions given.  I personally performed the services described in this documentation, which was scribed in my presence. The recorded  information has been reviewed and is accurate.     Santiago Glad, PA-C 08/05/14 1715

## 2014-08-06 NOTE — ED Provider Notes (Signed)
Medical screening examination/treatment/procedure(s) were performed by non-physician practitioner and as supervising physician I was immediately available for consultation/collaboration.   EKG Interpretation None        Matthew Gentry, MD 08/06/14 1423 

## 2015-10-14 ENCOUNTER — Emergency Department (HOSPITAL_COMMUNITY)
Admission: EM | Admit: 2015-10-14 | Discharge: 2015-10-14 | Disposition: A | Discharge status: Home / Self Care | Payer: Self-pay | Attending: Emergency Medicine | Admitting: Emergency Medicine

## 2015-10-14 ENCOUNTER — Emergency Department (EMERGENCY_DEPARTMENT_HOSPITAL)
Admit: 2015-10-14 | Discharge: 2015-10-14 | Disposition: A | Discharge status: Home / Self Care | Payer: Self-pay | Attending: Emergency Medicine | Admitting: Emergency Medicine

## 2015-10-14 ENCOUNTER — Encounter (HOSPITAL_COMMUNITY): Payer: Self-pay | Admitting: *Deleted

## 2015-10-14 DIAGNOSIS — M25569 Pain in unspecified knee: Secondary | ICD-10-CM

## 2015-10-14 DIAGNOSIS — F17210 Nicotine dependence, cigarettes, uncomplicated: Secondary | ICD-10-CM | POA: Insufficient documentation

## 2015-10-14 DIAGNOSIS — M25562 Pain in left knee: Secondary | ICD-10-CM | POA: Insufficient documentation

## 2015-10-14 MED ORDER — HYDROCODONE-ACETAMINOPHEN 5-325 MG PO TABS
2.0000 | ORAL_TABLET | Freq: Once | ORAL | Status: AC
  Administered 2015-10-14: 2 via ORAL
  Filled 2015-10-14: qty 2

## 2015-10-14 NOTE — ED Provider Notes (Signed)
Arrival Date & Time: 10/14/15 & 1348 History   Chief Complaint  Patient presents with  . Knee Pain   HPI [name] is a 39 y.o. female who presents 4 endorsement of left knee posterior pain the past 3 days. Patient endorses pain is not associated with pain upon range of motion however is painful on palpation and bearing weight. Patient reports no traumatic injuries swelling not localized to the joint per patient and radiates into the foot. Patient noted to be ambulatory without any distress or ataxia. Patient denies shortness of breath no fevers chills no prior infection of joints no history of rheumatologic disease or osteoarthritis or prior knee surgery. Patient denies warmth of the joint.  Past Medical History  I reviewed & agree with nursing's documentation of PMHx, PSHx, SHx & FHx. History reviewed. No pertinent past medical history. Past Surgical History  Procedure Laterality Date  . Tubal ligation     Social History   Social History  . Marital Status: Single    Spouse Name: N/A  . Number of Children: N/A  . Years of Education: N/A   Social History Main Topics  . Smoking status: Current Every Day Smoker -- 0.50 packs/day    Types: Cigarettes  . Smokeless tobacco: None  . Alcohol Use: Yes     Comment: 4/week  . Drug Use: No  . Sexual Activity: Yes    Birth Control/ Protection: None   Other Topics Concern  . None   Social History Narrative   History reviewed. No pertinent family history.  Review of Systems   Complete Review of Systems obtained and is negative except as stated in HPI.  Allergies  Review of patient's allergies indicates no known allergies.  Home Medications   Prior to Admission medications   Medication Sig Start Date End Date Taking? Authorizing Provider  ibuprofen (ADVIL,MOTRIN) 200 MG tablet Take 400 mg by mouth every 6 (six) hours as needed.   Yes Historical Provider, MD  naproxen (NAPROSYN) 500 MG tablet Take 1 tablet (500 mg  total) by mouth 2 (two) times daily. 08/05/14   Heather Laisure, PA-C  traMADol (ULTRAM) 50 MG tablet Take 1 tablet (50 mg total) by mouth every 6 (six) hours as needed. 08/05/14   Santiago Glad, PA-C    Physical Exam  BP 104/83 mmHg  Pulse 70  Temp(Src) 98.4 F (36.9 C) (Oral)  Resp 18  SpO2 99%  LMP 09/11/2015 Physical Exam Vitals & Nursing notes reviewed. CONST: female, in no acute distress. Appears WD/WN & stated age. HEAD: Leslie/AT. EYES: PERRL. No conjunctival injection & lids symmetrical. ENMT: External nose & ears atraumatic. MM moist.  Oropharynx w/o swelling or exudates. NECK: Supple, w/o meningismus. Trachea midline w/o JVD. Stridor absent. CVS: S1/S2 audible w/o gallops. Murmur absent. Peripheral pulses 2+ & equal in all extremities. Cap refill < 2 seconds. RESP: Respiratory effort normal. Lungs CTAB, w/o wheeze. GI: Soft, w/o TTP. Guarding & rebound absent. BS normal. BACK: W/o CVA TTP bilaterally. SKIN: Warm & dry, w/o rash. W/o open wound. NEURO: AAOx3. CN II-XIII grossly intact.  Sensation w/o deficit. Strength w/o deficit. Tremor absent. PSYCH:  Cooperative, w/ mood & affect appropriate. MSK: Extremities w/o deformity. Left knee with tenderness palpation is localized to posterior soft tissue muscle body and does not follow joint surface and there is no evident joint effusion. No superficial skin induration erythema or warmth and no painful masses. Joints stable, w/o warmth. W/o cyanosis.  ED Course  Procedures  Labs  Review Labs Reviewed - No data to display  Imaging Review No results found.  Laboratory and Imaging results were personally reviewed by myself and used in the medical decision making of this patient's treatment and disposition.  EKG Interpretation  EKG Interpretation  Date/Time:    Ventricular Rate:    PR Interval:    QRS Duration:   QT Interval:    QTC Calculation:   R Axis:     Text Interpretation:        MDM  Tanya PasseyStephanie D Doyle is  a 39 y.o. female with H&P as above. ED clinical course as follows: Patient in NAD. Afebrile without evidence of toxicity. Patient hemodynamically stable, without evidence of altered mentation. Patient without increased work of breathing and no evidence of hypoxia on room air.  Patient denies traumatic injury is bearing weight and with range of motion intact therefore do not suspect fracture dislocation meniscal or ligamentous injury at this time. Joints without concerning exam findings for infection or rheumatologic disease which patient has none prior. Patient has no shortness of breath however due to posterior knee pain concern have for DVT versus Baker's cyst therefore vascular ultrasound left lower extremity obtained and this was negative for acute venous thromboembolism or Baker's cyst. No concerning findings on scan patient is well-appearing bear weight. Is requesting discharge at this time return precautions discussed along with referral to physical therapy and symptomatically treatment at home with referral to PCP. Patient is no further concerns or questions to discharge.  Clinical Impression:  1. Knee pain    Patient care discussed with Dr. Rhunette CroftNanavati, who oversaw their evaluation & treatment & voiced agreement. House Officer: Jonette EvaBrad Jakye Mullens, MD, Emergency Medicine.  Jonette EvaBrad Ritik Stavola, MD 10/19/15 0400  Derwood KaplanAnkit Nanavati, MD 10/19/15 [phone]

## 2015-10-14 NOTE — Discharge Instructions (Signed)
Heat Therapy °Heat therapy can help ease sore, stiff, injured, and tight muscles and joints. Heat relaxes your muscles, which may help ease your pain.  °RISKS AND COMPLICATIONS °If you have any of the following conditions, do not use heat therapy unless your health care provider has approved: °· Poor circulation. °· Healing wounds or scarred skin in the area being treated. °· Diabetes, heart disease, or high blood pressure. °· Not being able to feel (numbness) the area being treated. °· Unusual swelling of the area being treated. °· Active infections. °· Blood clots. °· Cancer. °· Inability to communicate pain. This may include young children and people who have problems with their brain function (dementia). °· Pregnancy. °Heat therapy should only be used on old, pre-existing, or long-lasting (chronic) injuries. Do not use heat therapy on new injuries unless directed by your health care provider. °HOW TO USE HEAT THERAPY °There are several different kinds of heat therapy, including: °· Moist heat pack. °· Warm water bath. °· Hot water bottle. °· Electric heating pad. °· Heated gel pack. °· Heated wrap. °· Electric heating pad. °Use the heat therapy method suggested by your health care provider. Follow your health care provider's instructions on when and how to use heat therapy. °GENERAL HEAT THERAPY RECOMMENDATIONS °· Do not sleep while using heat therapy. Only use heat therapy while you are awake. °· Your skin may turn pink while using heat therapy. Do not use heat therapy if your skin turns red. °· Do not use heat therapy if you have new pain. °· High heat or long exposure to heat can cause burns. Be careful when using heat therapy to avoid burning your skin. °· Do not use heat therapy on areas of your skin that are already irritated, such as with a rash or sunburn. °SEEK MEDICAL CARE IF: °· You have blisters, redness, swelling, or numbness. °· You have new pain. °· Your pain is worse. °MAKE SURE  YOU: °· Understand these instructions. °· Will watch your condition. °· Will get help right away if you are not doing well or get worse. °  °This information is not intended to replace advice given to you by your health care provider. Make sure you discuss any questions you have with your health care provider. °  °Document Released: 02/10/2012 Document Revised: 12/09/2014 Document Reviewed: 01/11/2014 °Elsevier Interactive Patient Education ©2016 [employer] Inc. ° °Joint Pain °Joint pain, which is also called arthralgia, can be caused by many things. Joint pain often goes away when you follow your health care provider's instructions for relieving pain at home. However, joint pain can also be caused by conditions that require further treatment. Common causes of joint pain include: °· Bruising in the area of the joint. °· Overuse of the joint. °· Wear and tear on the joints that occur with aging (osteoarthritis). °· Various other forms of arthritis. °· A buildup of a crystal form of uric acid in the joint (gout). °· Infections of the joint (septic arthritis) or of the bone (osteomyelitis). °Your health care provider may recommend medicine to help with the pain. If your joint pain continues, additional tests may be needed to diagnose your condition. °HOME CARE INSTRUCTIONS °Watch your condition for any changes. Follow these instructions as directed to lessen the pain that you are feeling. °· Take medicines only as directed by your health care provider. °· Rest the affected area for as long as your health care provider says that you should. If directed to do so, raise   the painful joint above the level of your heart while you are sitting or lying down. °· Do not do things that cause or worsen pain. °· If directed, apply ice to the painful area: °¨ Put ice in a plastic bag. °¨ Place a towel between your skin and the bag. °¨ Leave the ice on for 20 minutes, 2-3 times per day. °· Wear an elastic bandage, splint, or sling as  directed by your health care provider. Loosen the elastic bandage or splint if your fingers or toes become numb and tingle, or if they turn cold and blue. °· Begin exercising or stretching the affected area as directed by your health care provider. Ask your health care provider what types of exercise are safe for you. °· Keep all follow-up visits as directed by your health care provider. This is important. °SEEK MEDICAL CARE IF: °· Your pain increases, and medicine does not help. °· Your joint pain does not improve within 3 days. °· You have increased bruising or swelling. °· You have a fever. °· You lose 10 lb (4.5 kg) or more without trying. °SEEK IMMEDIATE MEDICAL CARE IF: °· You are not able to move the joint. °· Your fingers or toes become numb or they turn cold and blue. °  °This information is not intended to replace advice given to you by your health care provider. Make sure you discuss any questions you have with your health care provider. °  °Document Released: 11/18/2005 Document Revised: 12/09/2014 Document Reviewed: 08/30/2014 °Elsevier Interactive Patient Education ©2016 Elsevier Inc. ° °

## 2015-10-14 NOTE — ED Notes (Signed)
Pt reports pain to back of left knee x 2-3 days. Reports swelling and pain that radiates down to her foot. Denies any injury. Ambulatory at triage.

## 2015-10-14 NOTE — ED Notes (Signed)
Pt stable, ambulatory, states understanding of discharge instructions 

## 2015-10-14 NOTE — Progress Notes (Signed)
VASCULAR LAB PRELIMINARY  PRELIMINARY  PRELIMINARY  PRELIMINARY  Left lower extremity venous duplex completed.    Preliminary report:  There is no DVT, SVT, or Baker's cyst noted in the left lower extremity.   Mileena Rothenberger, RVT 10/14/2015, 6:37 PM

## 2016-04-18 ENCOUNTER — Encounter (HOSPITAL_COMMUNITY): Payer: Self-pay | Admitting: *Deleted

## 2016-04-18 ENCOUNTER — Emergency Department (HOSPITAL_COMMUNITY)
Admission: EM | Admit: 2016-04-18 | Discharge: 2016-04-18 | Disposition: A | Discharge status: Home / Self Care | Payer: Self-pay | Attending: Emergency Medicine | Admitting: Emergency Medicine

## 2016-04-18 DIAGNOSIS — M546 Pain in thoracic spine: Secondary | ICD-10-CM | POA: Insufficient documentation

## 2016-04-18 DIAGNOSIS — M25511 Pain in right shoulder: Secondary | ICD-10-CM | POA: Insufficient documentation

## 2016-04-18 DIAGNOSIS — Z791 Long term (current) use of non-steroidal anti-inflammatories (NSAID): Secondary | ICD-10-CM | POA: Insufficient documentation

## 2016-04-18 DIAGNOSIS — F17210 Nicotine dependence, cigarettes, uncomplicated: Secondary | ICD-10-CM | POA: Insufficient documentation

## 2016-04-18 DIAGNOSIS — G8929 Other chronic pain: Secondary | ICD-10-CM

## 2016-04-18 MED ORDER — METHOCARBAMOL 500 MG PO TABS: 500.0000 mg | ORAL_TABLET | Freq: Two times a day (BID) | ORAL | Status: DC

## 2016-04-18 MED ORDER — NAPROXEN 500 MG PO TABS: 500.0000 mg | ORAL_TABLET | Freq: Two times a day (BID) | ORAL | Status: DC
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
## 2016-04-18 NOTE — Discharge Instructions (Signed)
Naprosyn for pain. Robaxin for muscle spasms. Follow up with primary care doctor for recheck if pain continues.   Muscle Cramps and Spasms Muscle cramps and spasms occur when a muscle or muscles tighten and you have no control over this tightening (involuntary muscle contraction). They are a common problem and can develop in any muscle. The most common place is in the calf muscles of the leg. Both muscle cramps and muscle spasms are involuntary muscle contractions, but they also have differences:   Muscle cramps are sporadic and painful. They may last a few seconds to a quarter of an hour. Muscle cramps are often more forceful and last longer than muscle spasms.  Muscle spasms may or may not be painful. They may also last just a few seconds or much longer. CAUSES  It is uncommon for cramps or spasms to be due to a serious underlying problem. In many cases, the cause of cramps or spasms is unknown. Some common causes are:   Overexertion.   Overuse from repetitive motions (doing the same thing over and over).   Remaining in a certain position for a long period of time.   Improper preparation, form, or technique while performing a sport or activity.   Dehydration.   Injury.   Side effects of some medicines.   Abnormally low levels of the salts and ions in your blood (electrolytes), especially potassium and calcium. This could happen if you are taking water pills (diuretics) or you are pregnant.  Some underlying medical problems can make it more likely to develop cramps or spasms. These include, but are not limited to:   Diabetes.   Parkinson disease.   Hormone disorders, such as thyroid problems.   Alcohol abuse.   Diseases specific to muscles, joints, and bones.   Blood vessel disease where not enough blood is getting to the muscles.  HOME CARE INSTRUCTIONS   Stay well hydrated. Drink enough water and fluids to keep your urine clear or pale yellow.  It may be  helpful to massage, stretch, and relax the affected muscle.  For tight or tense muscles, use a warm towel, heating pad, or hot shower water directed to the affected area.  If you are sore or have pain after a cramp or spasm, applying ice to the affected area may relieve discomfort.  Put ice in a plastic bag.  Place a towel between your skin and the bag.  Leave the ice on for 15-20 minutes, 03-04 times a day.  Medicines used to treat a known cause of cramps or spasms may help reduce their frequency or severity. Only take over-the-counter or prescription medicines as directed by your caregiver. SEEK MEDICAL CARE IF:  Your cramps or spasms get more severe, more frequent, or do not improve over time.  MAKE SURE YOU:   Understand these instructions.  Will watch your condition.  Will get help right away if you are not doing well or get worse.   This information is not intended to replace advice given to you by your health care provider. Make sure you discuss any questions you have with your health care provider.   Document Released: 05/10/2002 Document Revised: 03/15/2013 Document Reviewed: 11/04/2012 Elsevier Interactive Patient Education [employer]2016 Elsevier Inc.

## 2017-01-20 ENCOUNTER — Emergency Department (HOSPITAL_COMMUNITY)
Admission: EM | Admit: 2017-01-20 | Discharge: 2017-01-20 | Disposition: A | Discharge status: Home / Self Care | Payer: Self-pay | Attending: Emergency Medicine | Admitting: Emergency Medicine

## 2017-01-20 ENCOUNTER — Encounter (HOSPITAL_COMMUNITY): Payer: Self-pay

## 2017-01-20 ENCOUNTER — Emergency Department (HOSPITAL_COMMUNITY): Payer: Self-pay

## 2017-01-20 DIAGNOSIS — Y929 Unspecified place or not applicable: Secondary | ICD-10-CM | POA: Insufficient documentation

## 2017-01-20 DIAGNOSIS — M25521 Pain in right elbow: Secondary | ICD-10-CM | POA: Insufficient documentation

## 2017-01-20 DIAGNOSIS — F17210 Nicotine dependence, cigarettes, uncomplicated: Secondary | ICD-10-CM | POA: Insufficient documentation

## 2017-01-20 DIAGNOSIS — Y939 Activity, unspecified: Secondary | ICD-10-CM | POA: Insufficient documentation

## 2017-01-20 DIAGNOSIS — W19XXXA Unspecified fall, initial encounter: Secondary | ICD-10-CM | POA: Insufficient documentation

## 2017-01-20 DIAGNOSIS — Y999 Unspecified external cause status: Secondary | ICD-10-CM | POA: Insufficient documentation

## 2017-01-20 MED ORDER — OXYCODONE-ACETAMINOPHEN 5-325 MG PO TABS
1.0000 | ORAL_TABLET | Freq: Once | ORAL | Status: AC
  Administered 2017-01-20: 1 via ORAL
  Filled 2017-01-20: qty 1

## 2017-01-20 MED ORDER — NAPROXEN 375 MG PO TABS: 375.0000 mg | ORAL_TABLET | Freq: Two times a day (BID) | ORAL | 0 refills | Status: DC

## 2017-01-20 NOTE — ED Triage Notes (Signed)
Patient states she was diagnosed with golfers elbow in the ED. Followed up with ortho and now having recurrent tingling to right elbow and fingers, fell several weeks ago and that's when the recurrent symptoms started

## 2017-01-20 NOTE — ED Provider Notes (Signed)
[facility]-EMERGENCY DEPT Provider Note   CSN: [id number] Arrival date & time: 01/20/17  1418  By signing my name below, I, Linna Darner, attest that this documentation has been prepared under the direction and in the presence of Felicie Morn, NP. Electronically Signed: Linna Darner, Scribe. 01/20/2017. 4:23 PM.  History   Chief Complaint Chief Complaint  Patient presents with  . Numbness    The history is provided by the patient. No language interpreter was used.  Neurologic Problem  This is a new problem. The current episode started 6 to 12 hours ago. The problem occurs constantly. The problem has not changed since onset.Nothing aggravates the symptoms. Nothing relieves the symptoms. She has tried nothing for the symptoms.     HPI Comments: [name] is a right-hand dominant 41 y.o. female who presents to the Emergency Department complaining of numbness to the fingertips of her right hand beginning earlier today. She states she fell several days ago and struck her right elbow without hitting her head or losing consciousness. Pt reports pain to her right elbow since the fall and endorses pain radiation into her right hand; she notes her pain has occasionally been so severe that she has become nauseous. No vomiting. She also notes some focal weakness of her right hand since striking her elbow. She denies numbness anywhere except the fingertips of her right hand. Pt reports she was diagnosed with golfer's elbow in her right elbow ~ 1 year ago. Pt denies any other complaints at this time.  History reviewed. No pertinent past medical history.  There are no active problems to display for this patient.   Past Surgical History:  Procedure Laterality Date  . TUBAL LIGATION      OB History    No data available       Home Medications    Prior to Admission medications   Medication Sig Start Date End Date Taking? Authorizing Provider  ibuprofen (ADVIL,MOTRIN) 200 MG tablet Take  1,200 mg by mouth every 6 (six) hours as needed for fever, headache, mild pain, moderate pain or cramping.     Historical Provider, MD  methocarbamol (ROBAXIN) 500 MG tablet Take 1 tablet (500 mg total) by mouth 2 (two) times daily. 04/18/16   Tatyana Kirichenko, PA-C  naproxen (NAPROSYN) 500 MG tablet Take 1 tablet (500 mg total) by mouth 2 (two) times daily. 04/18/16   Jaynie Crumble, PA-C    Family History No family history on file.  Social History Social History  Substance Use Topics  . Smoking status: Current Every Day Smoker    Packs/day: 0.50    Types: Cigarettes  . Smokeless tobacco: Not on file  . Alcohol use Yes     Comment: occ     Allergies   Patient has no known allergies.   Review of Systems Review of Systems  Gastrointestinal: Positive for nausea. Negative for vomiting.  Musculoskeletal: Positive for arthralgias and myalgias.  Neurological: Positive for weakness and numbness. Negative for syncope.  All other systems reviewed and are negative.    Physical Exam Updated Vital Signs BP 119/91 (BP Location: Left Arm)   Pulse 87   Temp 98.1 F (36.7 C) (Oral)   Resp 16   Ht 5\' 2"  (1.575 m)   Wt 140 lb (63.5 kg)   SpO2 100%   BMI 25.61 kg/m   Physical Exam  Constitutional: She is oriented to person, place, and time. She appears well-developed and well-nourished. No distress.  HENT:  Head: Normocephalic  and atraumatic.  Eyes: Conjunctivae and EOM are normal.  Neck: Neck supple. No tracheal deviation present.  Cardiovascular: Normal rate.   Pulmonary/Chest: Effort normal. No respiratory distress.  Musculoskeletal: Normal range of motion. She exhibits tenderness.  Right elbow tenderness. No strength deficit. Good ROM.  Neurological: She is alert and oriented to person, place, and time.  Skin: Skin is warm and dry.  Psychiatric: She has a normal mood and affect. Her behavior is normal.  Nursing note and vitals reviewed.   ED Treatments / Results    Labs (all labs ordered are listed, but only abnormal results are displayed) Labs Reviewed - No data to display  EKG  EKG Interpretation None       Radiology Dg Elbow Complete Right  Result Date: 01/20/2017 CLINICAL DATA:  Right elbow posterior pain, fell today, hit elbow on wall EXAM: RIGHT ELBOW - COMPLETE 3+ VIEW COMPARISON:  None. FINDINGS: Four views of the right elbow submitted. No acute fracture or subluxation. No radiopaque foreign body. No posterior fat pad sign. IMPRESSION: Negative. Electronically Signed   By: Natasha MeadLiviu  Pop M.D.   On: 01/20/2017 14:44    Procedures Procedures (including critical care time)  DIAGNOSTIC STUDIES: Oxygen Saturation is 100% on RA, normal by my interpretation.    COORDINATION OF CARE: 4:28 PM Discussed treatment plan with pt at bedside and pt agreed to plan.  Medications Ordered in ED Medications - No data to display   Initial Impression / Assessment and Plan / ED Course  I have reviewed the triage vital signs and the nursing notes.  Pertinent labs & imaging results that were available during my care of the patient were reviewed by me and considered in my medical decision making (see chart for details).    Patient X-Ray negative for obvious fracture or dislocation.  Pt advised to follow up with orthopedics. Patient given sling while in ED, conservative therapy recommended and discussed. Patient will be discharged home & is agreeable with above plan. Returns precautions discussed. Pt appears safe for discharge.    Final Clinical Impressions(s) / ED Diagnoses   Final diagnoses:  Elbow pain, right    New Prescriptions Current Discharge Medication List    Naproxen 375 mg bid    I personally performed the services described in this documentation, which was scribed in my presence. The recorded information has been reviewed and is accurate.   Felicie Mornavid Kardell Virgil, NP 01/20/17 1715    Benjiman CoreNathan Pickering, MD 01/20/17 2029

## 2017-01-20 NOTE — ED Notes (Signed)
Patient originally complained of numbness to elbow and now reporting some discomfort to right arm per nurse first staff

## 2017-04-09 ENCOUNTER — Emergency Department (HOSPITAL_COMMUNITY)
Admission: EM | Admit: 2017-04-09 | Discharge: 2017-04-09 | Disposition: A | Discharge status: Home / Self Care | Payer: Self-pay | Attending: Emergency Medicine | Admitting: Emergency Medicine

## 2017-04-09 ENCOUNTER — Encounter (HOSPITAL_COMMUNITY): Payer: Self-pay | Admitting: Emergency Medicine

## 2017-04-09 DIAGNOSIS — Z79899 Other long term (current) drug therapy: Secondary | ICD-10-CM | POA: Insufficient documentation

## 2017-04-09 DIAGNOSIS — F17210 Nicotine dependence, cigarettes, uncomplicated: Secondary | ICD-10-CM | POA: Insufficient documentation

## 2017-04-09 DIAGNOSIS — R229 Localized swelling, mass and lump, unspecified: Secondary | ICD-10-CM

## 2017-04-09 DIAGNOSIS — R220 Localized swelling, mass and lump, head: Secondary | ICD-10-CM | POA: Insufficient documentation

## 2017-04-09 MED ORDER — ACETAMINOPHEN 325 MG PO TABS
650.0000 mg | ORAL_TABLET | Freq: Once | ORAL | Status: AC
  Administered 2017-04-09: 650 mg via ORAL
  Filled 2017-04-09: qty 2

## 2017-04-09 NOTE — ED Provider Notes (Signed)
[facility]PT Provider Note   CSN: [id number] Arrival date & time: 04/09/17  0906  By signing my name below, I, Marnette Burgessyan Andrew Long, attest that this documentation has been prepared under the direction and in the presence of [place] N. Russo, PA-C. Electronically Signed: Marnette Burgessyan Andrew Long, Scribe. 04/09/2017. 10:23 AM.  History   Chief Complaint Chief Complaint  Patient presents with  . Cyst   The history is provided by medical records and the patient. No language interpreter was used.    HPI Comments: [name] is a 41 y.o. female who presents to the Emergency Department complaining of a moderate, gradually worsening area of constant, aching, 6/10 pain and swelling to the left cheek onset last night. Pt reports an area of swelling to left cheek for ~15 years. Over the past two months, the area has gradually increased in size. Last night, she bit the inside of her cheek with the swollen mass now becoming increasingly painful. She has never had this area examined prior to today. Pt states pain is exacerbated with palpation, direct pressure, and sleeping on the affected left side. She tried Tylenol PM yesterday with no relief of her pain. Denies fever, chills, dental problem, difficulty swallowing, SOB, rash, night sweats, or drainage from the area. She has a PSHx of Tubal Ligation. Pt is a current every day smoker-- half a pack daily. Pt does not currently have a PCP.   History reviewed. No pertinent past medical history.  There are no active problems to display for this patient.  Past Surgical History:  Procedure Laterality Date  . TUBAL LIGATION     OB History    No data available     Home Medications    Prior to Admission medications   Medication Sig Start Date End Date Taking? Authorizing Provider  ibuprofen (ADVIL,MOTRIN) 200 MG tablet Take 1,200 mg by mouth every 6 (six) hours as needed for fever, headache, mild pain, moderate pain or cramping.     [provider]  methocarbamol (ROBAXIN) 500 MG tablet Take 1 tablet (500 mg total) by mouth 2 (two) times daily. 04/18/16   Kirichenko, Tatyana, PA-C  naproxen (NAPROSYN) 375 MG tablet Take 1 tablet (375 mg total) by mouth 2 (two) times daily. 01/20/17   Felicie MornSmith, David, NP    Family History History reviewed. No pertinent family history.  Social History Social History  Substance Use Topics  . Smoking status: Current Every Day Smoker    Packs/day: 0.50    Types: Cigarettes  . Smokeless tobacco: Never Used  . Alcohol use Yes     Comment: occ   Allergies   Patient has no known allergies.   Review of Systems Review of Systems  Constitutional: Negative for chills, diaphoresis and fever.  HENT: Positive for facial swelling (L cheek). Negative for dental problem and trouble swallowing.   Respiratory: Negative for shortness of breath.   Skin: Negative for rash and wound.   Physical Exam Updated Vital Signs BP 112/67 (BP Location: Left Arm)   Pulse 66   Temp 97.9 F (36.6 C) (Oral)   Resp 16   Ht 5\' 2"  (1.575 m)   Wt 59 kg   LMP 04/09/2017   SpO2 100%   BMI 23.78 kg/m   Physical Exam  Constitutional: She appears well-developed and well-nourished.  HENT:  Head: Normocephalic and atraumatic.  Mouth/Throat: Uvula is midline, oropharynx is clear and moist and mucous membranes are normal. No trismus in the jaw. No posterior  oropharyngeal edema or posterior oropharyngeal erythema.  1.5cm x 1.5cm round rubbery subcutaneous tender mass on the left cheek. Non-erythematous. Not draining. Buccal mucosa without wound, drainage or erythema.   Eyes: Conjunctivae are normal.  Neck: Normal range of motion. Neck supple. No thyromegaly present.  Cardiovascular: Normal rate.   Pulmonary/Chest: Effort normal. No stridor.  Lymphadenopathy:    She has no cervical adenopathy.  Psychiatric: She has a normal mood and affect. Her behavior is normal.  Nursing note and vitals reviewed.   ED  Treatments / Results  DIAGNOSTIC STUDIES:  Oxygen Saturation is 100% on RA, normal by my interpretation.    COORDINATION OF CARE:  10:20 AM Discussed treatment plan with pt at bedside including consult to Dr. Rubin Payor and ENT and pt agreed to plan.  Labs (all labs ordered are listed, but only abnormal results are displayed) Labs Reviewed - No data to display  EKG  EKG Interpretation None       Radiology No results found.  Procedures Procedures (including critical care time)  Medications Ordered in ED Medications  acetaminophen (TYLENOL) tablet 650 mg (650 mg Oral Given 04/09/17 1035)     Initial Impression / Assessment and Plan / ED Course  I have reviewed the triage vital signs and the nursing notes.  Pertinent labs & imaging results that were available during my care of the patient were reviewed by me and considered in my medical decision making (see chart for details).     Patient with subcutaneous mass left cheek. Ultrasound, performed by Dr. Rubin Payor, showed solid mass, not filled with fluid. Not amenable to incision and drainage. Patient afebrile, tolerating secretions. Discharge with ENT follow-up and symptomatic management. Pt safe for discharge.  Patient discussed with and seen by Dr. Rubin Payor.  Discussed results, findings, treatment and follow up. Patient advised of return precautions. Patient verbalized understanding and agreed with plan.    Final Clinical Impressions(s) / ED Diagnoses   Final diagnoses:  Subcutaneous mass    New Prescriptions Discharge Medication List as of 04/09/2017 10:52 AM      I personally performed the services described in this documentation, which was scribed in my presence. The recorded information has been reviewed and is accurate.     Russo, [place] N, PA-C 04/09/17 1707    Benjiman Core, MD 04/10/17 [phone]

## 2017-04-09 NOTE — Discharge Instructions (Signed)
Please read instructions below.  Call the Ear Nose and Throat office to schedule an appointment to follow up on your current complaint today. You can take tylenol or advil as needed for pain. You can apply ice to your face for 15 minutes at a time.  Return to the ER for difficulty swallowing, difficulty breathing, fever, new or worsening symptoms.

## 2017-04-09 NOTE — ED Triage Notes (Signed)
Pt has had a cyst in her left cheek for years. Yesterday she hit it and it is swollen and painful. No drainage or redness noted.

## 2017-06-06 ENCOUNTER — Emergency Department (HOSPITAL_COMMUNITY)
Admission: EM | Admit: 2017-06-06 | Discharge: 2017-06-06 | Disposition: A | Discharge status: Home / Self Care | Payer: Self-pay | Attending: Emergency Medicine | Admitting: Emergency Medicine

## 2017-06-06 ENCOUNTER — Encounter (HOSPITAL_COMMUNITY): Payer: Self-pay | Admitting: Emergency Medicine

## 2017-06-06 DIAGNOSIS — F17210 Nicotine dependence, cigarettes, uncomplicated: Secondary | ICD-10-CM | POA: Insufficient documentation

## 2017-06-06 DIAGNOSIS — M79601 Pain in right arm: Secondary | ICD-10-CM | POA: Insufficient documentation

## 2017-06-06 MED ORDER — KETOROLAC TROMETHAMINE 10 MG PO TABS
10.0000 mg | ORAL_TABLET | Freq: Once | ORAL | Status: AC
  Administered 2017-06-06: 10 mg via ORAL
  Filled 2017-06-06: qty 1

## 2017-06-06 MED ORDER — LIDOCAINE 5 % EX PTCH
1.0000 | MEDICATED_PATCH | CUTANEOUS | Status: DC
  Administered 2017-06-06: 1 via TRANSDERMAL
  Filled 2017-06-06: qty 1

## 2017-06-06 NOTE — ED Triage Notes (Signed)
Pt presents with c/o R elbow pain, recurrent. Pt states he now has "knot" present on R forearm and was not able to go to work tonight d/t pain. Pt states does have numbness/tingling but that too is not new but pain did get better so she did not follow up with specialist.  CMS intact. No new injury

## 2017-06-06 NOTE — ED Notes (Signed)
The pt is c/o rt elbow pain for 2 days.  No known injury  Where she works her work is repetitive  And she is also c/o numbness in her fingers  She thinks its swollen on the medial forearm

## 2017-06-06 NOTE — ED Provider Notes (Signed)
[facility] Provider Note   CSN: [id number] Arrival date & time: 06/06/17  1919     History   Chief Complaint Chief Complaint  Patient presents with  . elbow pain    HPI [name] is a 41 y.o. female.  The history is provided by the patient.  Illness  This is a recurrent problem. The current episode started more than 2 days ago. The problem occurs daily. The problem has been gradually worsening. Pertinent negatives include no chest pain, no abdominal pain, no headaches and no shortness of breath. Associated symptoms comments: Right forearm and right elbow pain. The symptoms are aggravated by twisting and exertion. The symptoms are relieved by rest. She has tried acetaminophen (minimal) for the symptoms.    History reviewed. No pertinent past medical history.  There are no active problems to display for this patient.   Past Surgical History:  Procedure Laterality Date  . TUBAL LIGATION      OB History    No data available       Home Medications    Prior to Admission medications   Medication Sig Start Date End Date Taking? Authorizing Provider  ibuprofen (ADVIL,MOTRIN) 200 MG tablet Take 800 mg by mouth every 6 (six) hours as needed (for pain).    Yes [provider]  methocarbamol (ROBAXIN) 500 MG tablet Take 1 tablet (500 mg total) by mouth 2 (two) times daily. Patient not taking: Reported on 06/06/2017 04/18/16   Jaynie CrumbleKirichenko, Tatyana, PA-C  naproxen (NAPROSYN) 375 MG tablet Take 1 tablet (375 mg total) by mouth 2 (two) times daily. Patient not taking: Reported on 06/06/2017 01/20/17   Felicie MornSmith, David, NP    Family History No family history on file.  Social History Social History  Substance Use Topics  . Smoking status: Current Every Day Smoker    Packs/day: 0.50    Types: Cigarettes  . Smokeless tobacco: Never Used  . Alcohol use Yes     Comment: occ     Allergies   Patient has no known allergies.   Review of Systems Review of  Systems  Constitutional: Negative for chills and fever.  HENT: Negative for ear pain and sore throat.   Eyes: Negative for pain and visual disturbance.  Respiratory: Negative for cough and shortness of breath.   Cardiovascular: Negative for chest pain and palpitations.  Gastrointestinal: Negative for abdominal pain and vomiting.  Genitourinary: Negative for dysuria and hematuria.  Musculoskeletal: Negative for arthralgias and back pain.       Right forearm and right elbow pain  Skin: Negative for color change and rash.  Neurological: Negative for seizures, syncope and headaches.  All other systems reviewed and are negative.    Physical Exam Updated Vital Signs BP 100/68 (BP Location: Left Arm)   Pulse 93   Temp 98.2 F (36.8 C) (Oral)   Resp 16   Ht 5\' 2"  (1.575 m)   Wt 54.4 kg (120 lb)   LMP 05/23/2017   SpO2 99%   BMI 21.95 kg/m   Physical Exam  Constitutional: She is oriented to person, place, and time. She appears well-developed and well-nourished. No distress.  HENT:  Head: Normocephalic and atraumatic.  Eyes: Conjunctivae are normal.  Neck: Neck supple.  Cardiovascular: Normal rate and regular rhythm.   No murmur heard. Pulmonary/Chest: Effort normal and breath sounds normal. No respiratory distress.  Abdominal: Soft. There is no tenderness.  Musculoskeletal: Normal range of motion. She exhibits tenderness. She exhibits no edema  or deformity.  Tenderness to palpation of the volar aspect of the right forearm as well as the medial epicondyle of the right elbow. Neurovascularly intact with normal range of motion.  Neurological: She is alert and oriented to person, place, and time.  Skin: Skin is warm and dry.  Psychiatric: She has a normal mood and affect.  Nursing note and vitals reviewed.    ED Treatments / Results  Labs (all labs ordered are listed, but only abnormal results are displayed) Labs Reviewed - No data to display  EKG  EKG Interpretation None        Radiology No results found.  Procedures Procedures (including critical care time)  Medications Ordered in ED Medications  lidocaine (LIDODERM) 5 % 1 patch (1 patch Transdermal Patch Applied 06/06/17 2128)  ketorolac (TORADOL) tablet 10 mg (10 mg Oral Given 06/06/17 2125)     Initial Impression / Assessment and Plan / ED Course  I have reviewed the triage vital signs and the nursing notes.  Pertinent labs & imaging results that were available during my care of the patient were reviewed by me and considered in my medical decision making (see chart for details).     [name] is a 41 year old female with history of right arm pain is coming in today with Right forearm and elbow pain. 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 Patient does report a history of medial epicondylitis. States she has tried ibuprofen for the pain however it has not helped. Denies any injections in that arm or other trauma to the arm. No nausea, vomiting, fevers, chills, chest pain, shortness of breath, abdominal pain.  Exam she is sitting up in bed in no apparent distress. Neurovascularly intact in the right upper extremity. Normal range of motion of fingers, wrist, elbow. Patient does endorse some tenderness to palpation along the volar aspect of the forearm as well as the medial epicondyle. There is no erythema or swelling in the arm. Likely musculoskeletal strain. Will be given by mouth Toradol as well as lidocaine patch for the forearm.  Upon reassessment at 10:05 PM, patient voiced complete resolution of her pain. Likely tendinitis/muscular skeletal pain. She was comfortable with discharge home with outpatient follow-up. Given the community health physicians phone number.  Patient was seen with my attending, Dr. Deretha Emory, who voiced agreement and  oversaw the evaluation and treatment of this patient.   Dragon [employer] illustrator was used in the creation of this note. If there are any errors or inconsistencies needing clarification, please contact me directly.   Final Clinical Impressions(s) / ED Diagnoses   Final diagnoses:  Right arm pain    New Prescriptions New Prescriptions   No medications on file     Orson Slick, MD 06/06/17 2213    Vanetta Mulders, MD 06/12/17 [phone]

## 2017-06-06 NOTE — ED Provider Notes (Signed)
I saw and evaluated the patient, reviewed the resident's note and I agree with the findings and plan.   EKG Interpretation None       Results for orders placed or performed during the hospital encounter of 01/07/14  Rapid strep screen   (If patient has fever and/or without cough or runny nose)  Result Value Ref Range   Streptococcus, Group A Screen (Direct) NEGATIVE NEGATIVE  Culture, Group A Strep  Result Value Ref Range   Specimen Description THROAT    Special Requests NONE    Culture      No Beta Hemolytic Streptococci Isolated Performed at [facility]   Report Status 01/09/2014 FINAL    No results found.  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 She is at the complaint of right elbow pain that's medial radiates down the arm. Pain prevented her from going to work [employer]. Symptoms consistent with a tendinitis. Radial pulse distally is 2+ good movement of her fingers. Gross sensation of fingers intact Refill intact. Patient is right-hand dominant. No direct injury. Probably repetitive injury tendinitis. Patient will be treated with lidocaine patches. And rest.   Vanetta MuldersZackowski, Vaiden Adames, MD 06/06/17 2214

## 2017-07-10 ENCOUNTER — Emergency Department (HOSPITAL_COMMUNITY)
Admission: EM | Admit: 2017-07-10 | Discharge: 2017-07-10 | Disposition: A | Discharge status: Home / Self Care | Payer: Self-pay | Attending: Emergency Medicine | Admitting: Emergency Medicine

## 2017-07-10 ENCOUNTER — Encounter (HOSPITAL_COMMUNITY): Payer: Self-pay | Admitting: Emergency Medicine

## 2017-07-10 DIAGNOSIS — M7701 Medial epicondylitis, right elbow: Secondary | ICD-10-CM | POA: Insufficient documentation

## 2017-07-10 DIAGNOSIS — F17210 Nicotine dependence, cigarettes, uncomplicated: Secondary | ICD-10-CM | POA: Insufficient documentation

## 2017-07-10 MED ORDER — KETOROLAC TROMETHAMINE 60 MG/2ML IM SOLN
30.0000 mg | Freq: Once | INTRAMUSCULAR | Status: AC
  Administered 2017-07-10: 30 mg via INTRAMUSCULAR
  Filled 2017-07-10: qty 2

## 2017-07-10 NOTE — ED Provider Notes (Signed)
[facility] Provider Note   CSN: [id number] Arrival date & time: 07/10/17  [id number]  By signing my name below, I, Rosana Fretana Waskiewicz, attest that this documentation has been prepared under the direction and in the presence of non-physician practitioner, Michela PitcherFawze, Tiphani Mells, PA-C. Electronically Signed: Rosana Fretana Waskiewicz, ED Scribe. 07/10/17. 10:10 AM.  History   Chief Complaint Chief Complaint  Patient presents with  . elbow pain   The history is provided by the patient. No language interpreter was used.   HPI Comments: [name] is a 41 y.o. female who presents to the Emergency Department complaining of Gradual onset, progressively worsening right elbow pain onset 1 week ago. 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 Pt was seen in the ED for same symptoms about a month ago as well, which she states improved until acute worsening 1 week ago. Pt describes pain as radiating into her triceps. 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 Pt reports associated occasional numbness radiating into the fingers. She states her symptoms feel the same as prior exacerbations of her golfer's elbow.  No recent trauma or falls. Pt has tried lidocaine patches, ibuprofen and ice with minimal relief. Pt denies fever or any other complaints at this time.  History reviewed. No pertinent past medical history.  There are no active problems to display for this patient.   Past Surgical History:  Procedure Laterality Date  . TUBAL LIGATION      OB History    No data available       Home Medications    Prior to Admission medications   Medication Sig Start Date End Date Taking? Authorizing Provider  ibuprofen (ADVIL,MOTRIN) 200 MG tablet Take 800 mg by mouth every 6 (six) hours as needed (for pain).     [provider]  methocarbamol (ROBAXIN) 500 MG tablet Take 1 tablet (500 mg total) by  mouth 2 (two) times daily. Patient not taking: Reported on 06/06/2017 04/18/16   Jaynie CrumbleKirichenko, Tatyana, PA-C  naproxen (NAPROSYN) 375 MG tablet Take 1 tablet (375 mg total) by mouth 2 (two) times daily. Patient not taking: Reported on 06/06/2017 01/20/17   Felicie MornSmith, David, NP    Family History No family history on file.  Social History Social History  Substance Use Topics  . Smoking status: Current Every Day Smoker    Packs/day: 0.50    Types: Cigarettes  . Smokeless tobacco: Never Used  . Alcohol use Yes     Comment: occ     Allergies   Patient has no known allergies.   Review of Systems Review of Systems  Constitutional: Negative for fever.  Musculoskeletal: Positive for arthralgias and myalgias.  Neurological: Positive for numbness.     Physical Exam Updated Vital Signs BP 114/79   Pulse 77   Temp 98.1 F (36.7 C)   Resp 19   SpO2 100%   Physical Exam  Constitutional: She appears well-developed and well-nourished. No distress.  HENT:  Head: Normocephalic and atraumatic.  Eyes: Conjunctivae are normal. Right eye exhibits no discharge. Left eye exhibits no discharge.  Neck: No JVD present. No tracheal deviation present.  Cardiovascular: Normal rate and intact distal pulses.   2+ radial pulses bilaterally  Pulmonary/Chest: Effort normal.  Abdominal: She exhibits no distension.  Musculoskeletal: She exhibits no edema.       Right elbow: She exhibits normal range of motion, no swelling, no effusion,  no deformity and no laceration. Tenderness found. Medial epicondyle tenderness noted.  TTP to the medial epicondyle radiating up to the distal right upper arm.  No deformity, crepitus, erythema, warmth or swelling noted. Normal ROM of the bilateral elbows. 5/5 strength of BUE major muscle groups, however with pain on testing of the biceps and triceps of the right arm.   Neurological: She is alert.  Fluent speech, no facial droop, sensation intact to soft touch of bilateral upper  extremities with good grip strength  Skin: Skin is warm and dry. Capillary refill takes less than 2 seconds. No erythema.  Psychiatric: She has a normal mood and affect. Her behavior is normal.  Nursing note and vitals reviewed.    ED Treatments / Results  DIAGNOSTIC STUDIES: Oxygen Saturation is 100% on RA, normal by my interpretation.   COORDINATION OF CARE: 10:09 AM-Discussed next steps with pt including rest, treatment with ibuprofen/tylenol and follow up with ortho/PCP. Pt verbalized understanding and is agreeable with the plan.   Labs (all labs ordered are listed, but only abnormal results are displayed) Labs Reviewed - No data to display  EKG  EKG Interpretation None       Radiology No results found.  Procedures Procedures (including critical care time)  Medications Ordered in ED Medications  ketorolac (TORADOL) injection 30 mg (not administered)     Initial Impression / Assessment and Plan / ED Course  I have reviewed the triage vital signs and the nursing notes.  Pertinent labs & imaging results that were available during my care of the patient were reviewed by me and considered in my medical decision making (see chart for details).     Patient presentation consistent with exacerbation of medial epicondylitis of the right elbow which she has experienced before. Afebrile, vital signs are stable, and she is neurovascularly intact. No evidence of septic joint, gout, bursitis, fracture, or dislocation. She stable for discharge home with supportive treatment and work note avoiding repetitive motion. She will follow-up with primary care or orthopedics for reevaluation and further management. Pain managed while in the ED. Discussed indications for return to the ED. Pt verbalized understanding of and agreement with plan and is safe for discharge home at this time.  Final Clinical Impressions(s) / ED Diagnoses   Final diagnoses:  Medial epicondylitis of right elbow      New Prescriptions New Prescriptions   No medications on file   I personally performed the services described in this documentation, which was scribed in my presence. The recorded information has been reviewed and is accurate.    Jeanie Sewer, PA-C 07/10/17 1052    Vanetta Mulders, MD 07/11/17 [phone]

## 2017-07-10 NOTE — ED Triage Notes (Addendum)
Pt reports hx of golfers elbow. Pt states she works at [employer] and needs a note clearing her for work.  Pt states right elbow pain has gotten worse. 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

## 2017-07-10 NOTE — Discharge Instructions (Signed)
Alternate 600 mg of ibuprofen and [phone] mg of Tylenol every 3 hours as needed for pain. Do not exceed 4000 mg of Tylenol daily. Apply ice or heat to the affected area for comfort. I have attached exercises that I recommend you do once or twice daily. Follow up with a primary care physician or orthopedic doctor for reevaluation. Return to the ED immediately if any concerning signs or symptoms develop such as fevers, redness or swelling to the area, or weakness.

## 2017-07-10 NOTE — ED Notes (Signed)
Heat pack applied to elbow for pain relief

## 2017-07-13 ENCOUNTER — Emergency Department (HOSPITAL_COMMUNITY)
Admission: EM | Admit: 2017-07-13 | Discharge: 2017-07-13 | Disposition: A | Discharge status: Home / Self Care | Payer: Self-pay | Attending: Emergency Medicine | Admitting: Emergency Medicine

## 2017-07-13 ENCOUNTER — Encounter (HOSPITAL_COMMUNITY): Payer: Self-pay | Admitting: Emergency Medicine

## 2017-07-13 DIAGNOSIS — Y939 Activity, unspecified: Secondary | ICD-10-CM | POA: Insufficient documentation

## 2017-07-13 DIAGNOSIS — M778 Other enthesopathies, not elsewhere classified: Secondary | ICD-10-CM

## 2017-07-13 DIAGNOSIS — M70831 Other soft tissue disorders related to use, overuse and pressure, right forearm: Secondary | ICD-10-CM | POA: Insufficient documentation

## 2017-07-13 DIAGNOSIS — F17210 Nicotine dependence, cigarettes, uncomplicated: Secondary | ICD-10-CM | POA: Insufficient documentation

## 2017-07-13 NOTE — ED Triage Notes (Signed)
States was seen on Thursday and was told she had golfers elbow and needs a note to go back to work... Per her work

## 2017-07-13 NOTE — ED Triage Notes (Signed)
Pt requesting a work note to clear her to return to work

## 2017-07-13 NOTE — ED Notes (Signed)
Declined W/C at D/C and was escorted to lobby by RN. 

## 2017-07-13 NOTE — ED Provider Notes (Signed)
[facility] Provider Note   CSN: [id number] Arrival date & time: 07/13/17  1127  By signing my name below, I, Vista Minkobert Ross, attest that this documentation has been prepared under the direction and in the presence of Shawn Joy PA-C.  Electronically Signed: Vista Minkobert Ross, ED Scribe. 07/13/17. 12:21 PM.   History   Chief Complaint Chief Complaint  Patient presents with  . Elbow Pain    HPI HPI Comments: [name] is a 41 y.o. female who presents to the Emergency Department requesting clearance for work after being seen here three days ago.  Pt was seen here for right shoulder pain which was due to overuse of her right arm, specifically exacerbated with pronation of the right forearm. She had tenderness to medial and lateral right elbow which has been improving with instructed use of ibuprofen and tylenol which has been successful in relieving her pain. During that visit, she received a note that she could return to work on August 13, however, it also came with lifting restrictions. Patient states she cannot actually return to work until her note says that she has no restrictions. She states she is a one income household and needs return to work. Patient states her pain has significantly improved, rates it 2/10, described as a soreness. She denies numbness, weakness, fever, additional pain, or any other complaints. She has a follow-up appointment with a PCP scheduled this week.    The history is provided by the patient. No language interpreter was used.    History reviewed. No pertinent past medical history.  There are no active problems to display for this patient.   Past Surgical History:  Procedure Laterality Date  . TUBAL LIGATION      OB History    No data available       Home Medications    Prior to Admission medications   Medication Sig Start Date End Date Taking? Authorizing Provider  ibuprofen (ADVIL,MOTRIN) 200 MG tablet Take 800 mg by mouth every  6 (six) hours as needed (for pain).     [provider]  methocarbamol (ROBAXIN) 500 MG tablet Take 1 tablet (500 mg total) by mouth 2 (two) times daily. Patient not taking: Reported on 06/06/2017 04/18/16   Jaynie CrumbleKirichenko, Tatyana, PA-C  naproxen (NAPROSYN) 375 MG tablet Take 1 tablet (375 mg total) by mouth 2 (two) times daily. Patient not taking: Reported on 06/06/2017 01/20/17   Felicie MornSmith, David, NP    Family History No family history on file.  Social History Social History  Substance Use Topics  . Smoking status: Current Every Day Smoker    Packs/day: 0.50    Types: Cigarettes  . Smokeless tobacco: Never Used  . Alcohol use Yes     Comment: occ   Allergies   Patient has no known allergies.  Review of Systems Review of Systems  Musculoskeletal: Positive for arthralgias ("sore"). Negative for joint swelling.  Skin: Negative for color change and wound.  Neurological: Negative for weakness and numbness.  All other systems reviewed and are negative.  Physical Exam Updated Vital Signs BP 105/79   Pulse 89   Temp 98.3 F (36.8 C) (Oral)   Resp 16   SpO2 98%   Physical Exam  Constitutional: She appears well-developed and well-nourished. No distress.  HENT:  Head: Normocephalic and atraumatic.  Eyes: Conjunctivae are normal.  Neck: Neck supple.  Cardiovascular: Normal rate, regular rhythm and intact distal pulses.   Pulmonary/Chest: Effort normal.  Musculoskeletal: Normal range of motion.  She exhibits tenderness. She exhibits no edema or deformity.  Pain with pronation of the right forearm. Tenderness noted to medial and lateral right elbow, worse over lateral elbow. Full range of motion in the right elbow, wrist, and shoulder without difficulty or hesitation.  Neurological: She is alert.  No sensory deficits noted in the right upper extremity. Grip strength as well as strength in the wrists, elbows, and shoulders 5/5 bilaterally.  Skin: Skin is warm and dry. Capillary  refill takes less than 2 seconds. She is not diaphoretic.  Psychiatric: She has a normal mood and affect. Her behavior is normal.  Nursing note and vitals reviewed.  ED Treatments / Results  DIAGNOSTIC STUDIES: Oxygen Saturation is 98% on RA, normal by my interpretation.  COORDINATION OF CARE: 12:21 PM-Discussed treatment plan with pt at bedside and pt agreed to plan.   Labs (all labs ordered are listed, but only abnormal results are displayed) Labs Reviewed - No data to display  EKG  EKG Interpretation None       Radiology No results found.  Procedures Procedures (including critical care time)  Medications Ordered in ED Medications - No data to display   Initial Impression / Assessment and Plan / ED Course  I have reviewed the triage vital signs and the nursing notes.  Pertinent labs & imaging results that were available during my care of the patient were reviewed by me and considered in my medical decision making (see chart for details).     Patient requests work note to return back to work. She states that her symptoms have significantly improved. She was informed that her symptoms are likely to recur with reintroduction of the repetitive movements that originally caused her pain. She voices understanding of this. She has PCP follow up on this matter. The patient was given instructions for home care as well as return precautions. Patient voices understanding of these instructions, accepts the plan, and is comfortable with discharge.     Final Clinical Impressions(s) / ED Diagnoses   Final diagnoses:  Tendonitis of elbow, right    New Prescriptions Discharge Medication List as of 07/13/2017 12:25 PM     I personally performed the services described in this documentation, which was scribed in my presence. The recorded information has been reviewed and is accurate.    Anselm Pancoast, PA-C 07/13/17 1237    Doug Sou, MD 07/13/17 1715

## 2017-07-13 NOTE — Discharge Instructions (Signed)
You have been seen today to follow up on elbow pain. You may return to work, but it is important to know that if repetitive motion is continued without adequate rest and therapy, the issue will recur. Pain: Take 600 mg of ibuprofen every 6 hours or 440 mg (over the counter dose) to 500 mg (prescription dose) of naproxen every 12 hours or for the next 3 days. After this time, these medications may be used as needed for pain. Take these medications with food to avoid upset stomach. Choose only one of these medications, do not take them together.  Tylenol: Should you continue to have additional pain while taking the ibuprofen or naproxen, you may add in tylenol as needed. Your daily total maximum amount of tylenol from all sources should be limited to 4000mg /day for persons without liver problems, or 2000mg /day for those with liver problems. Ice: May apply ice to the area over the next 24 hours for 15 minutes at a time to reduce swelling. Elevation: Keep the extremity elevated as often as possible to reduce pain and inflammation. Support: Wear the ACE wrap for support and comfort. You can wear this until pain resolves.  Exercises: Start by performing these exercises a few times a week, increasing the frequency until you are performing them twice daily.  Follow up: Follow up with the primary care provider, as planned. If symptoms are not improving, you may follow up with the orthopedic specialist.

## 2017-08-15 ENCOUNTER — Ambulatory Visit (HOSPITAL_COMMUNITY)
Admission: EM | Admit: 2017-08-15 | Discharge: 2017-08-15 | Disposition: A | Discharge status: Home / Self Care | Payer: Self-pay | Attending: Family Medicine | Admitting: Family Medicine

## 2017-08-15 ENCOUNTER — Encounter (HOSPITAL_COMMUNITY): Payer: Self-pay | Admitting: Emergency Medicine

## 2017-08-15 DIAGNOSIS — M79601 Pain in right arm: Secondary | ICD-10-CM

## 2017-08-15 MED ORDER — PREDNISONE 20 MG PO TABS: ORAL_TABLET | ORAL | 0 refills | Status: DC

## 2017-08-15 NOTE — Discharge Instructions (Signed)
Return if symptoms continue

## 2017-08-15 NOTE — ED Provider Notes (Signed)
[facility]    CSN: [id number] Arrival date & time: 08/15/17  1415     History   Chief Complaint Chief Complaint  Patient presents with  . Arm Pain    HPI [name] is a 41 y.o. female.   Pt here for right arm pain onset this am while at work  Sts she works at [employer]  Pain was bad that her fingers contracted as well. She describes the syndrome as like a "charlie horse" in the arm.  Taking ibuprofen with minimal relief, although the muscle spasm has subsided.  Hx of "golfer's elbow"... Has not seen specialist... Has been to ER for the same reason mult times  A&O x4... NAD... Ambulatory       History reviewed. No pertinent past medical history.  There are no active problems to display for this patient.   Past Surgical History:  Procedure Laterality Date  . TUBAL LIGATION      OB History    No data available       Home Medications    Prior to Admission medications   Medication Sig Start Date End Date Taking? Authorizing Provider  ibuprofen (ADVIL,MOTRIN) 200 MG tablet Take 800 mg by mouth every 6 (six) hours as needed (for pain).     [provider]  predniSONE (DELTASONE) 20 MG tablet Two daily with food 08/15/17   Elvina Sidle, MD    Family History History reviewed. No pertinent family history.  Social History Social History  Substance Use Topics  . Smoking status: Current Every Day Smoker    Packs/day: 0.50    Types: Cigarettes  . Smokeless tobacco: Never Used  . Alcohol use Yes     Comment: occ     Allergies   Patient has no known allergies.   Review of Systems Review of Systems  Musculoskeletal: Positive for myalgias.  All other systems reviewed and are negative.    Physical Exam Triage Vital Signs ED Triage Vitals  Enc Vitals Group     BP 08/15/17 1510 98/62     Pulse Rate 08/15/17 1510 72     Resp 08/15/17 1510 20     Temp 08/15/17 1510 98.6 F (37 C)     Temp Source  08/15/17 1510 Oral     SpO2 08/15/17 1510 100 %     Weight --      Height --      Head Circumference --      Peak Flow --      Pain Score 08/15/17 1511 7     Pain Loc --      Pain Edu? --      Excl. in GC? --    No data found.   Updated Vital Signs BP 98/62 (BP Location: Left Arm)   Pulse 72   Temp 98.6 F (37 C) (Oral)   Resp 20   LMP 07/18/2017   SpO2 100%   Visual Acuity Right Eye Distance:   Left Eye Distance:   Bilateral Distance:    Right Eye Near:   Left Eye Near:    Bilateral Near:     Physical Exam  Constitutional: She appears well-developed and well-nourished.  HENT:  Right Ear: External ear normal.  Left Ear: External ear normal.  Mouth/Throat: Oropharynx is clear and moist.  Eyes: Pupils are equal, round, and reactive to light. Conjunctivae are normal.  Neck: Normal range of motion. Neck supple.  Pulmonary/Chest: Effort normal.  Musculoskeletal: Normal  range of motion. She exhibits tenderness. She exhibits no deformity.  Mild medial epicondylar tenderness.  No swelling  Skin: Skin is warm and dry.  Nursing note and vitals reviewed.    UC Treatments / Results  Labs (all labs ordered are listed, but only abnormal results are displayed) Labs Reviewed - No data to display  EKG  EKG Interpretation None       Radiology No results found.  Procedures Procedures (including critical care time)  Medications Ordered in UC Medications - No data to display   Initial Impression / Assessment and Plan / UC Course  I have reviewed the triage vital signs and the nursing notes.  Pertinent labs & imaging results that were available during my care of the patient were reviewed by me and considered in my medical decision making (see chart for details).      Final Clinical Impressions(s) / UC Diagnoses   Final diagnoses:  Right arm pain    New Prescriptions New Prescriptions   PREDNISONE (DELTASONE) 20 MG TABLET    Two daily with food      Controlled Substance Prescriptions [place] Controlled Substance Registry consulted? Not Applicable   Elvina Sidle, MD 08/15/17 [phone]

## 2017-08-15 NOTE — ED Triage Notes (Signed)
Pt here for right arm pain onset this am while at work  Sts she works at [employer]  Pain was bad that her fingers contracted as well.   Hx of "golfer's elbow"... Has not seen specialist... Has been to ER for the same reason mult times  A&O x4... NAD... Ambulatory

## 2017-10-04 ENCOUNTER — Emergency Department (HOSPITAL_COMMUNITY)
Admission: EM | Admit: 2017-10-04 | Discharge: 2017-10-04 | Disposition: A | Discharge status: Home / Self Care | Payer: Self-pay | Attending: Emergency Medicine | Admitting: Emergency Medicine

## 2017-10-04 DIAGNOSIS — Y9289 Other specified places as the place of occurrence of the external cause: Secondary | ICD-10-CM | POA: Insufficient documentation

## 2017-10-04 DIAGNOSIS — F17210 Nicotine dependence, cigarettes, uncomplicated: Secondary | ICD-10-CM | POA: Insufficient documentation

## 2017-10-04 DIAGNOSIS — Y9389 Activity, other specified: Secondary | ICD-10-CM | POA: Insufficient documentation

## 2017-10-04 DIAGNOSIS — S0501XA Injury of conjunctiva and corneal abrasion without foreign body, right eye, initial encounter: Secondary | ICD-10-CM | POA: Insufficient documentation

## 2017-10-04 DIAGNOSIS — X58XXXA Exposure to other specified factors, initial encounter: Secondary | ICD-10-CM | POA: Insufficient documentation

## 2017-10-04 DIAGNOSIS — Y990 Civilian activity done for income or pay: Secondary | ICD-10-CM | POA: Insufficient documentation

## 2017-10-04 MED ORDER — ERYTHROMYCIN 5 MG/GM OP OINT
1.0000 "application " | TOPICAL_OINTMENT | Freq: Four times a day (QID) | OPHTHALMIC | Status: DC
  Administered 2017-10-04: 1 via OPHTHALMIC
  Filled 2017-10-04: qty 3.5

## 2017-10-04 MED ORDER — FLUORESCEIN SODIUM 1 MG OP STRP
1.0000 | ORAL_STRIP | Freq: Once | OPHTHALMIC | Status: AC
  Administered 2017-10-04: 1 via OPHTHALMIC
  Filled 2017-10-04: qty 1

## 2017-10-04 MED ORDER — TETRACAINE HCL 0.5 % OP SOLN
2.0000 [drp] | Freq: Once | OPHTHALMIC | Status: AC
  Administered 2017-10-04: 2 [drp] via OPHTHALMIC
  Filled 2017-10-04: qty 4

## 2017-10-04 NOTE — ED Provider Notes (Signed)
[facility] EMERGENCY DEPARTMENT Provider Note   CSN: [id number] Arrival date & time: 10/04/17  1447     History   Chief Complaint Chief Complaint  Patient presents with  . Eye Problem    HPI [name] is a 41 y.o. female.  HPI [name] is a 41 y.o. female presents emergency department complaining of right eye pain, tearing, redness.  Patient states she was outside eating lunch today when she felt like something got into her right eye.  She states since then I is very itchy, watery.  She has tried artificial tears which has not helped.  She states she had to leave work because of her symptoms and was told that she needed a work note to come back in.  Denies any other eye trauma.  No history of contact or glasses wear.  Denies any prior eye issues.  Denies any visual changes.  No photophobia.  No headache, nausea, vomiting.  No other complaints.  No past medical history on file.  There are no active problems to display for this patient.   Past Surgical History:  Procedure Laterality Date  . TUBAL LIGATION      OB History    No data available       Home Medications    Prior to Admission medications   Medication Sig Start Date End Date Taking? Authorizing Provider  ibuprofen (ADVIL,MOTRIN) 200 MG tablet Take 800 mg by mouth every 6 (six) hours as needed (for pain).     [provider]  predniSONE (DELTASONE) 20 MG tablet Two daily with food 08/15/17   Elvina Sidle, MD    Family History No family history on file.  Social History Social History  Substance Use Topics  . Smoking status: Current Every Day Smoker    Packs/day: 0.50    Types: Cigarettes  . Smokeless tobacco: Never Used  . Alcohol use Yes     Comment: occ     Allergies   Patient has no known allergies.   Review of Systems Review of Systems  Constitutional: Negative for chills and fever.  Eyes: Positive for pain, discharge, redness and  itching. Negative for photophobia and visual disturbance.  Respiratory: Negative for cough, chest tightness and shortness of breath.   Cardiovascular: Negative for chest pain, palpitations and leg swelling.  Musculoskeletal: Negative for arthralgias, myalgias, neck pain and neck stiffness.  Skin: Negative for rash.  Neurological: Negative for dizziness, weakness and headaches.  All other systems reviewed and are negative.    Physical Exam Updated Vital Signs BP 100/82   Pulse 86   Temp 98.8 F (37.1 C) (Oral)   Resp 16   SpO2 100%   Physical Exam  Constitutional: She is oriented to person, place, and time. She appears well-developed and well-nourished. No distress.  HENT:  Head: Normocephalic and atraumatic.  Eyes: Pupils are equal, round, and reactive to light.  Right conjunctivitis is mildly erythematous.  Clear  drainage.  Eyelids swept, no foreign body.  Upper eyelid inverted.  Fluorescein stain showed punctate corneal abrasion.  No corneal ulcers.  No hyphema or hypopyon.  Neck: Neck supple.  Neurological: She is alert and oriented to person, place, and time.  Skin: Skin is warm and dry.  Nursing note and vitals reviewed.    ED Treatments / Results  Labs (all labs ordered are listed, but only abnormal results are displayed) Labs Reviewed - No data to display  EKG  EKG Interpretation None  Radiology No results found.  Procedures Procedures (including critical care time)  Medications Ordered in ED Medications  erythromycin ophthalmic ointment 1 application (not administered)  tetracaine (PONTOCAINE) 0.5 % ophthalmic solution 2 drop (2 drops Right Eye Given 10/04/17 1723)  fluorescein ophthalmic strip 1 strip (1 strip Right Eye Given 10/04/17 1723)     Initial Impression / Assessment and Plan / ED Course  I have reviewed the triage vital signs and the nursing notes.  Pertinent labs & imaging results that were available during my care of the patient  were reviewed by me and considered in my medical decision making (see chart for details).     Patient with right eye irritation.  Exam was consistent with a very small corneal abrasion, most likely for foreign body.  No foreign body identified.  Will continue with artificial tears, erythromycin ointment every 6 hours.  Follow-up with ophthalmology.  Visual acuity is 20/20 in the right, 20/15 and left.  No other concerning findings at this time.  Instructed to follow-up with ophthalmology if symptoms not improving by Monday.  Vitals:   10/04/17 1515  BP: 100/82  Pulse: 86  Resp: 16  Temp: 98.8 F (37.1 C)  TempSrc: Oral  SpO2: 100%    Final Clinical Impressions(s) / ED Diagnoses   Final diagnoses:  Abrasion of right cornea, initial encounter    New Prescriptions New Prescriptions   No medications on file     Jaynie CrumbleKirichenko, Kree Armato, PA-C 10/04/17 1756    Shaune PollackIsaacs, Cameron, MD 10/05/17 [phone]

## 2017-10-04 NOTE — ED Triage Notes (Addendum)
Onset today pt was on break at work and got something in right eye.  Eye tearing.

## 2017-10-04 NOTE — ED Notes (Signed)
Declined W/C at D/C and was escorted to lobby by RN. 

## 2017-10-04 NOTE — Discharge Instructions (Signed)
Continue to use artificial tears for symptom relief.  Ibuprofen or Tylenol for any pain.  Erythromycin ointment every 6 hours for the next 7 days.  Follow-up with ophthalmology if not improved by Monday.

## 2017-10-17 ENCOUNTER — Encounter (HOSPITAL_COMMUNITY): Payer: Self-pay | Admitting: *Deleted

## 2017-10-17 ENCOUNTER — Other Ambulatory Visit: Payer: Self-pay

## 2017-10-17 ENCOUNTER — Emergency Department (HOSPITAL_COMMUNITY)
Admission: EM | Admit: 2017-10-17 | Discharge: 2017-10-17 | Disposition: A | Discharge status: Home / Self Care | Payer: Self-pay | Attending: Emergency Medicine | Admitting: Emergency Medicine

## 2017-10-17 DIAGNOSIS — Z79899 Other long term (current) drug therapy: Secondary | ICD-10-CM | POA: Insufficient documentation

## 2017-10-17 DIAGNOSIS — M25521 Pain in right elbow: Secondary | ICD-10-CM

## 2017-10-17 DIAGNOSIS — F17210 Nicotine dependence, cigarettes, uncomplicated: Secondary | ICD-10-CM | POA: Insufficient documentation

## 2017-10-17 MED ORDER — KETOROLAC TROMETHAMINE 15 MG/ML IJ SOLN
15.0000 mg | Freq: Once | INTRAMUSCULAR | Status: AC
  Administered 2017-10-17: 15 mg via INTRAMUSCULAR
  Filled 2017-10-17: qty 1

## 2017-10-17 MED ORDER — DICLOFENAC SODIUM 1 % TD GEL: 2.0000 g | Freq: Four times a day (QID) | TRANSDERMAL | 0 refills | Status: DC

## 2017-10-17 NOTE — ED Triage Notes (Signed)
Pt c/o R elbow pain for a yr, new worsening pain this week, denies past or present injury, pt states, "I have been recommended to somewhere by you guys before but cannot afford it." pt A&O x4, no swelling or deformity noted, pt states, "it is golfers elbow."

## 2017-10-17 NOTE — ED Notes (Signed)
Pt directed to bathroom, ambulated without difficulty

## 2017-10-17 NOTE — ED Provider Notes (Signed)
[facility]   CSN: [id number] Arrival date & time: 10/17/17  1049     History   Chief Complaint Chief Complaint  Patient presents with  . Elbow Pain    HPI [name] is a 41 y.o. female.  HPI   Patient is a 41 year old female with no significant past medical history presenting for acute on chronic pain of the right elbow.  Patient reports that is been hurting for several months, but she does not have insurance and is unable to afford the orthopedic copay to have it further evaluated.  Patient was previously told on an orthopedic visit that she had "golfer's elbow". Patient reports that it hurts posterior to the medial epicondyle of the humerus and she will get occasional radiating pains down her right arm.  Patient reports pain is a 10 out of 10.  Patient has tried lidocaine patches, icy hot, Tylenol, and ibuprofen without full relief.  Patient denies any weakness in the right hand or pain or symptoms affecting more proximal to the elbow.  No erythema or edema of right elbow. 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  History reviewed. No pertinent past medical history.  There are no active problems to display for this patient.   Past Surgical History:  Procedure Laterality Date  . TUBAL LIGATION      OB History    No data available       Home Medications    Prior to Admission medications   Medication Sig Start Date End Date Taking? Authorizing Provider  diclofenac sodium (VOLTAREN) 1 % GEL Apply 2 g 4 (four) times daily topically. 10/17/17   [name], Oday Ridings B, PA-C  ibuprofen (ADVIL,MOTRIN) 200 MG tablet Take 800 mg by mouth every 6 (six) hours as needed (for pain).     [provider]  predniSONE (DELTASONE) 20 MG tablet Two daily with food 08/15/17   [name], [name], MD    Family History No family history on  file.  Social History Social History   Tobacco Use  . Smoking status: Current Every Day Smoker    Packs/day: 0.50    Types: Cigarettes  . Smokeless tobacco: Never Used  Substance Use Topics  . Alcohol use: Yes    Comment: occ  . Drug use: No     Allergies   Patient has no known allergies.   Review of Systems Review of Systems  Constitutional: Negative for fever.  Musculoskeletal: Positive for arthralgias. Negative for joint swelling.  Skin: Negative for color change.  Neurological: Negative for weakness.     Physical Exam Updated Vital Signs BP 110/73   Pulse 75   Temp 98.2 F (36.8 C) (Oral)   Resp 12   Ht 5\' 2"  (1.575 m)   Wt 56.7 kg (125 lb)   LMP 10/12/2017 (Approximate)   SpO2 99%   BMI 22.86 kg/m   Physical Exam  Constitutional: She appears well-developed and well-nourished. No distress.  Sitting comfortably in bed.  HENT:  Head: Normocephalic and atraumatic.  Eyes: Conjunctivae are normal. Right eye exhibits no discharge. Left eye exhibits no discharge.  EOMs normal to gross examination.  Neck: Normal range of motion.  Cardiovascular: Normal rate and regular rhythm.  Intact, 2+ radial and ulnar pulse of RUE.  Pulmonary/Chest:  Normal respiratory effort. Patient converses comfortably. No audible wheeze or stridor.  Abdominal: She exhibits no  distension.  Musculoskeletal: Normal range of motion.  No erythema or edema of the right elbow.  Point tenderness just posterior to the medial epicondyle of the left elbow.  Paresthesias produced on tapping of the cubital tunnel.  5 out of 5 grip strength of the right upper extremity.  -Radial: Sensation to light touch intact at dorsal CMC joint. -Median: Sensation to light touch intact on palmar side of 1st and 2nd digits. -Ulnar: Sensation to light touch intact on palmar side of 5th digit. Vascular: 2+ radial and ulnar pulses. Capillary refill <2 seconds b/l.   Neurological: She is alert.  Cranial nerves  intact to gross observation. Patient moves extremities without difficulty.  Skin: Skin is warm and dry. She is not diaphoretic.  Psychiatric: She has a normal mood and affect. Her behavior is normal. Judgment and thought content normal.  Nursing note and vitals reviewed.    ED Treatments / Results  Labs (all labs ordered are listed, but only abnormal results are displayed) Labs Reviewed - No data to display  EKG  EKG Interpretation None       Radiology No results found.  Procedures Procedures (including critical care time)  Medications Ordered in ED Medications  ketorolac (TORADOL) 15 MG/ML injection 15 mg (15 mg Intramuscular Given 10/17/17 1411)     Initial Impression / Assessment and Plan / ED Course  I have reviewed the triage vital signs and the nursing notes.  Pertinent labs & imaging results that were available during my care of the patient were reviewed by me and considered in my medical decision making (see chart for details).     Final Clinical Impressions(s) / ED Diagnoses   Final diagnoses:  Right elbow pain   Differential diagnosis includes cubital tunnel syndrome versus medial epicondylitis.  Toradol administered in ED today for pain. I discussed that this is a repetitive motion injury. Prescribed Voltaren gel to apply to the site of pain.  I discussed purchasing a brace to wear just distal to the right elbow to change the fulcrum of the tendons. Return precautions given for any loss of function of the RUE or symptoms affecting the entire RUE.  Patient is in understanding and agrees with the plan of care.  ED Discharge Orders        Ordered    diclofenac sodium (VOLTAREN) 1 % GEL  4 times daily     10/17/17 1436       Delia ChimesMurray, Donivin Wirt B, PA-C 10/17/17 1944    Gwyneth SproutPlunkett, Whitney, MD 10/17/17 2020

## 2017-10-17 NOTE — ED Notes (Signed)
Pt upset in room on phone stating she has been here for hours and still hasn't seen a doctor.

## 2017-10-17 NOTE — ED Notes (Signed)
ED Provider at bedside. 

## 2017-10-17 NOTE — Discharge Instructions (Signed)
Please follow-up with [facility] or the orthopedist listed.  I also provided diclofenac gel.  This you can rub up to 4 times daily on the area that is hurting.  I also provided a picture of the brace I would like you to get.  Please return if you have any loss of function of the extremity or if your symptoms are affecting the entire right extremity.

## 2017-12-08 ENCOUNTER — Encounter (HOSPITAL_COMMUNITY): Payer: Self-pay

## 2017-12-08 ENCOUNTER — Emergency Department (HOSPITAL_COMMUNITY)
Admission: EM | Admit: 2017-12-08 | Discharge: 2017-12-08 | Disposition: A | Discharge status: Home / Self Care | Payer: Self-pay | Attending: Emergency Medicine | Admitting: Emergency Medicine

## 2017-12-08 ENCOUNTER — Other Ambulatory Visit: Payer: Self-pay

## 2017-12-08 DIAGNOSIS — F17210 Nicotine dependence, cigarettes, uncomplicated: Secondary | ICD-10-CM | POA: Insufficient documentation

## 2017-12-08 DIAGNOSIS — H66001 Acute suppurative otitis media without spontaneous rupture of ear drum, right ear: Secondary | ICD-10-CM | POA: Insufficient documentation

## 2017-12-08 MED ORDER — AMOXICILLIN 500 MG PO CAPS: 500.0000 mg | ORAL_CAPSULE | Freq: Three times a day (TID) | ORAL | 0 refills | Status: DC

## 2017-12-08 NOTE — ED Triage Notes (Signed)
Per Pt, Pt is coming from home with congestion, cough, no fevers noted, Yellow sputum, and head pressure noted. Generalized not feeling.

## 2017-12-08 NOTE — ED Provider Notes (Signed)
[name]   CSN: [id number] Arrival date & time: 12/08/17  1319     History   Chief Complaint Chief Complaint  Patient presents with  . Nasal Congestion  . Headache    HPI Tanya PasseyStephanie D Doyle is a 42 y.o. female.  The history is provided by the patient. No language interpreter was used.  Sore Throat  This is a new problem. The problem occurs constantly. The problem has been gradually worsening. Nothing aggravates the symptoms. Nothing relieves the symptoms. She has tried nothing for the symptoms. The treatment provided no relief.  Pt complains of a sore throat, earache, cough and congestion   History reviewed. No pertinent past medical history.  There are no active problems to display for this patient.   Past Surgical History:  Procedure Laterality Date  . TUBAL LIGATION      OB History    No data available       Home Medications    Prior to Admission medications   Medication Sig Start Date End Date Taking? Authorizing Provider  Chlorphen-Phenyleph-ASA (ALKA-SELTZER PLUS COLD PO) Take 2 tablets by mouth as needed (Cold symptoms).   Yes [provider]  diclofenac sodium (VOLTAREN) 1 % GEL Apply 2 g 4 (four) times daily topically. Patient not taking: Reported on 12/08/2017 10/17/17   Tanya KluverMurray, Tanya B, PA-C  ibuprofen (ADVIL,MOTRIN) 200 MG tablet Take 800 mg by mouth every 6 (six) hours as needed (for pain).     [provider]  predniSONE (DELTASONE) 20 MG tablet Two daily with food Patient not taking: Reported on 12/08/2017 08/15/17   [name], [name], MD    Family History No family history on file.  Social History Social History   Tobacco Use  . Smoking status: Current Every Day Smoker    Packs/day: 0.50    Types: Cigarettes  . Smokeless tobacco: Never Used  Substance Use Topics  . Alcohol use: Yes    Comment: occ  . Drug use: No     Allergies   Patient has no known  allergies.   Review of Systems Review of Systems  All other systems reviewed and are negative.    Physical Exam Updated Vital Signs BP 134/90 (BP Location: Right Arm)   Pulse 95   Temp 98.1 F (36.7 C) (Oral)   Resp 16   Ht 5\' 2"  (1.575 m)   Wt 54.4 kg (120 lb)   SpO2 99%   BMI 21.95 kg/m   Physical Exam  Constitutional: She is oriented to person, place, and time. She appears well-developed and well-nourished.  HENT:  Head: Normocephalic and atraumatic.  Mouth/Throat: Posterior oropharyngeal erythema present.  Tender maxillary sinuses,  Eyes: Conjunctivae and EOM are normal. Pupils are equal, round, and reactive to light.  Neck: Normal range of motion. Neck supple.  Cardiovascular: Normal rate.  Pulmonary/Chest: Effort normal.  Abdominal: Soft. She exhibits no distension.  Musculoskeletal: Normal range of motion.  Neurological: She is alert and oriented to person, place, and time. She has normal strength.  Skin: Skin is warm and dry.  Psychiatric: She has a normal mood and affect.  Nursing note and vitals reviewed. Scarring left tm,  Erythema right tm   ED Treatments / Results  Labs (all labs ordered are listed, but only abnormal results are displayed) Labs Reviewed - No data to display  EKG  EKG Interpretation None       Radiology No results found.  Procedures Procedures (including  critical care time)  Medications Ordered in ED Medications - No data to display   Initial Impression / Assessment and Plan / ED Course  I have reviewed the triage vital signs and the nursing notes.  Pertinent labs & imaging results that were available during my care of the patient were reviewed by me and considered in my medical decision making (see chart for details).       Final Clinical Impressions(s) / ED Diagnoses   Final diagnoses:  Acute suppurative otitis media of right ear without spontaneous rupture of tympanic membrane, recurrence not specified    ED  Discharge Orders        Ordered    amoxicillin (AMOXIL) 500 MG capsule  3 times daily     12/08/17 1624    An After Visit Summary was printed and given to the patient.    Osie Cheeks 12/08/17 1625    Benjiman Core, MD 12/08/17 2032

## 2017-12-08 NOTE — Discharge Instructions (Signed)
Return if any problems.

## 2018-01-05 ENCOUNTER — Encounter (HOSPITAL_COMMUNITY): Payer: Self-pay | Admitting: Emergency Medicine

## 2018-01-05 ENCOUNTER — Emergency Department (HOSPITAL_COMMUNITY)
Admission: EM | Admit: 2018-01-05 | Discharge: 2018-01-05 | Disposition: A | Discharge status: Home / Self Care | Payer: Self-pay | Attending: Emergency Medicine | Admitting: Emergency Medicine

## 2018-01-05 ENCOUNTER — Emergency Department (HOSPITAL_BASED_OUTPATIENT_CLINIC_OR_DEPARTMENT_OTHER)
Admit: 2018-01-05 | Discharge: 2018-01-05 | Disposition: A | Discharge status: Home / Self Care | Payer: Self-pay | Attending: Emergency Medicine | Admitting: Emergency Medicine

## 2018-01-05 DIAGNOSIS — F17210 Nicotine dependence, cigarettes, uncomplicated: Secondary | ICD-10-CM | POA: Insufficient documentation

## 2018-01-05 DIAGNOSIS — M79609 Pain in unspecified limb: Secondary | ICD-10-CM

## 2018-01-05 DIAGNOSIS — M79605 Pain in left leg: Secondary | ICD-10-CM | POA: Insufficient documentation

## 2018-01-05 DIAGNOSIS — R600 Localized edema: Secondary | ICD-10-CM | POA: Insufficient documentation

## 2018-01-05 DIAGNOSIS — Z79899 Other long term (current) drug therapy: Secondary | ICD-10-CM | POA: Insufficient documentation

## 2018-01-05 MED ORDER — METHOCARBAMOL 500 MG PO TABS: 1000.0000 mg | ORAL_TABLET | Freq: Four times a day (QID) | ORAL | 0 refills | Status: DC | PRN

## 2018-01-05 MED ORDER — OXYCODONE-ACETAMINOPHEN 5-325 MG PO TABS
1.0000 | ORAL_TABLET | Freq: Once | ORAL | Status: AC
  Administered 2018-01-05: 1 via ORAL
  Filled 2018-01-05: qty 1

## 2018-01-05 NOTE — ED Triage Notes (Addendum)
Pt reports L leg swelling and pain since Saturday. 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 No known injury. Tried ace wraps and elevation with no relief.  No recent travel. Pt ambulatory to triage. Denies SOB.

## 2018-01-05 NOTE — ED Provider Notes (Signed)
Patient placed in Quick Look pathway, seen and evaluated   Chief Complaint: left leg pain  HPI:   Left leg pain and swelling x 2 days No numbness  No chest pain or sob No bcps or recent travel   ROS: no dyspnea (one)  Physical Exam:   Gen: No distress  Neuro: Awake and Alert  Skin: Warm    Focused Exam: pain and swelling to left popliteal region, NVT at left le   Initiation of care has begun. The patient has been counseled on the process, plan, and necessity for staying for the completion/evaluation, and the remainder of the medical screening examination    Lorre NickAllen, Suzzane Quilter, MD 01/05/18 [phone]

## 2018-01-05 NOTE — Discharge Instructions (Signed)
Rest, Ice intermittently (in the first 24-48 hours), Gentle compression with an Ace wrap, and elevate (Limb above the level of the heart)   Take up to 800mg  of ibuprofen (that is usually 4 over the counter pills)  3 times a day for 5 days. Take with food.  For breakthrough pain you may take Robaxin. Do not drink alcohol, drive or operate heavy machinery when taking Robaxin.  Please follow with your primary care doctor in the next 2 days for a check-up. They must obtain records for further management.   Do not hesitate to return to the Emergency Department for any new, worsening or concerning symptoms.

## 2018-01-05 NOTE — ED Notes (Signed)
Bed: [facility]4 Expected date:  Expected time:  Means of arrival:  Comments: 

## 2018-01-05 NOTE — Progress Notes (Signed)
LLE venous duplex prelim: negative for DVT. Meryl Hubers Eunice, RDMS, RVT  

## 2018-01-05 NOTE — ED Provider Notes (Signed)
[facility]-EMERGENCY DEPT Provider Note   CSN: [id number] Arrival date & time: 01/05/18  1336     History   Chief Complaint Chief Complaint  Patient presents with  . Leg Swelling    HPI   Blood pressure 106/81, pulse 96, temperature 98.9 F (37.2 C), temperature source Oral, resp. rate 16, height 5\' 2"  (1.575 m), weight 54 kg (119 lb), SpO2 99 %.  [name] is a 42 y.o. female complaining of right calf pain and swelling worsening over the course of the last several days.  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  She is been taking ibuprofen at home with little relief, triage initiated Percocet with good relief.  She denies any trauma, chest pain, shortness of breath, palpitations.  She denies any history of DVT/PE, recent immobilizations, exogenous estrogen.  History reviewed. No pertinent past medical history.  There are no active problems to display for this patient.   Past Surgical History:  Procedure Laterality Date  . TUBAL LIGATION      OB History    No data available       Home Medications    Prior to Admission medications   Medication Sig Start Date End Date Taking? Authorizing Provider  amoxicillin (AMOXIL) 500 MG capsule Take 1 capsule (500 mg total) by mouth 3 (three) times daily. 12/08/17   Elson AreasSofia, Leslie K, PA-C  Chlorphen-Phenyleph-ASA (ALKA-SELTZER PLUS COLD PO) Take 2 tablets by mouth as needed (Cold symptoms).    [provider]  diclofenac sodium (VOLTAREN) 1 % GEL Apply 2 g 4 (four) times daily topically. Patient not taking: Reported on 12/08/2017 10/17/17   [name], [name], PA-C  ibuprofen (ADVIL,MOTRIN) 200 MG tablet Take 800 mg by mouth every 6 (six) hours as needed (for pain).     [provider]  methocarbamol (ROBAXIN) 500 MG tablet Take 2 tablets (1,000 mg total) by mouth every 6 (six) hours as needed for muscle spasms. 01/05/18   Jessic Standifer, Joni ReiningNicole, PA-C  predniSONE (DELTASONE) 20 MG tablet  Two daily with food Patient not taking: Reported on 12/08/2017 08/15/17   [name], [name], MD    Family History History reviewed. No pertinent family history.  Social History Social History   Tobacco Use  . Smoking status: Current Every Day Smoker    Packs/day: 0.50    Types: Cigarettes  . Smokeless tobacco: Never Used  Substance Use Topics  . Alcohol use: Yes    Comment: occ  . Drug use: No     Allergies   Patient has no known allergies.   Review of Systems Review of Systems  A complete review of systems was obtained and all systems are negative except as noted in the HPI and PMH.   Physical Exam Updated Vital Signs BP 117/78 (BP Location: Left Arm)   Pulse 65   Temp 98.4 F (36.9 C) (Oral)   Resp 20   Ht 5\' 2"  (1.575 m)   Wt 54 kg (119 lb)   SpO2 100%   BMI 21.77 kg/m   Physical Exam  Constitutional: She is oriented to person, place, and time. She appears well-developed and well-nourished. No distress.  HENT:  Head: Normocephalic and atraumatic.  Mouth/Throat: Oropharynx is clear and moist.  Eyes: Conjunctivae and EOM are normal. Pupils are equal, round, and reactive to light.  Neck: Normal range of motion.  Cardiovascular: Normal rate, regular rhythm and intact distal pulses.  Pulmonary/Chest: Effort normal and breath sounds normal.  Abdominal: Soft. There is no tenderness.  Musculoskeletal: Normal range of motion. She exhibits edema and tenderness.  Left calf with no warmth, mild edema, tenderness along the medial aspect.  No superficial collateral or palpable cords.  Distally neurovascularly intact, full range of motion to knee.  Neurological: She is alert and oriented to person, place, and time.  Skin: She is not diaphoretic.  Psychiatric: She has a normal mood and affect.  Nursing note and vitals reviewed.    ED Treatments / Results  Labs (all labs ordered are listed, but only abnormal results are displayed) Labs Reviewed - No data to  display  EKG  EKG Interpretation None        Procedures Procedures (including critical care time)  Medications Ordered in ED Medications  oxyCODONE-acetaminophen (PERCOCET/ROXICET) 5-325 MG per tablet 1 tablet (1 tablet Oral Given 01/05/18 1608)     Initial Impression / Assessment and Plan / ED Course  I have reviewed the triage vital signs and the nursing notes.  Pertinent labs & imaging results that were available during my care of the patient were reviewed by me and considered in my medical decision making (see chart for details).     Vitals:   01/05/18 1414 01/05/18 1738  BP: 106/81 117/78  Pulse: 96 65  Resp: 16 20  Temp: 98.9 F (37.2 C) 98.4 F (36.9 C)  TempSrc: Oral Oral  SpO2: 99% 100%  Weight: 54 kg (119 lb)   Height: 5\' 2"  (1.575 m)     Medications  oxyCODONE-acetaminophen (PERCOCET/ROXICET) 5-325 MG per tablet 1 tablet (1 tablet Oral Given 01/05/18 1608)    [name] is 42 y.o. female presenting with atraumatic left lower extremity swelling and pain.  On my exam she has a very mild swelling to the left calf, I think this may be a muscle spasm.  Preliminary read on vascular Doppler study negative.  Patient given crutches, work note and muscle relaxer for pain control.  Evaluation does not show pathology that would require ongoing emergent intervention or inpatient treatment. Pt is hemodynamically stable and mentating appropriately. Discussed findings and plan with patient/guardian, who agrees with care plan. All questions answered. Return precautions discussed and outpatient follow up given.      Final Clinical Impressions(s) / ED Diagnoses   Final diagnoses:  Left leg pain    ED Discharge Orders        Ordered    methocarbamol (ROBAXIN) 500 MG tablet  Every 6 hours PRN     01/05/18 1725       Dynastie Knoop, Mardella Layman 01/05/18 2151    Arby Barrette, MD 01/06/18 1840

## 2018-02-01 ENCOUNTER — Encounter (HOSPITAL_COMMUNITY): Payer: Self-pay | Admitting: *Deleted

## 2018-02-01 ENCOUNTER — Emergency Department (HOSPITAL_COMMUNITY)
Admission: EM | Admit: 2018-02-01 | Discharge: 2018-02-01 | Disposition: A | Discharge status: Home / Self Care | Payer: Self-pay | Attending: Emergency Medicine | Admitting: Emergency Medicine

## 2018-02-01 DIAGNOSIS — M7701 Medial epicondylitis, right elbow: Secondary | ICD-10-CM | POA: Insufficient documentation

## 2018-02-01 DIAGNOSIS — F17210 Nicotine dependence, cigarettes, uncomplicated: Secondary | ICD-10-CM | POA: Insufficient documentation

## 2018-02-01 DIAGNOSIS — Z79899 Other long term (current) drug therapy: Secondary | ICD-10-CM | POA: Insufficient documentation

## 2018-02-01 DIAGNOSIS — R202 Paresthesia of skin: Secondary | ICD-10-CM | POA: Insufficient documentation

## 2018-02-01 MED ORDER — MELOXICAM 15 MG PO TABS: 15.0000 mg | ORAL_TABLET | Freq: Every day | ORAL | 0 refills | Status: DC

## 2018-02-01 NOTE — ED Provider Notes (Signed)
[facility]-EMERGENCY DEPT Provider Note   CSN: [id number] Arrival date & time: 02/01/18  1139     History   Chief Complaint Chief Complaint  Patient presents with  . Arm Pain    HPI [name] is a 42 y.o. female with a PMHx of medial epicondylitis, who presents to the ED with complaints of recurrent medial epicondylitis pain.  Patient is right-hand dominant and does a lot of repetitive pronation and supination all day at work, and has had medial epicondylitis before, states that yesterday she seemed aggravated after a 15-hour shift.  She describes the pain today is 6.5/10 constant sharp medial elbow pain that radiates into the hand, worse with movement or activity of the arm, and slightly improved with warm soaks, unrelieved with ibuprofen.  She reports tingling in all of her fingertips on the right hand.  She denies any swelling, erythema or warmth, bruising, numbness, focal weakness, or any other complaints at this time.  She saw an orthopedist "a long time ago" who first diagnosed her with this issue, however she has not returned to them and wants a referral for a new orthopedist.    The history is provided by the patient and medical records. No language interpreter was used.  Arm Pain  This is a recurrent problem. The current episode started yesterday. The problem occurs constantly. The problem has not changed since onset.Exacerbated by: activity with her arm. The symptoms are relieved by heat. Treatments tried: ibuprofen. The treatment provided no relief.    History reviewed. No pertinent past medical history.  There are no active problems to display for this patient.   Past Surgical History:  Procedure Laterality Date  . TUBAL LIGATION      OB History    No data available       Home Medications    Prior to Admission medications   Medication Sig Start Date End Date Taking? Authorizing Provider  amoxicillin (AMOXIL) 500 MG capsule Take 1  capsule (500 mg total) by mouth 3 (three) times daily. 12/08/17   Elson Areas, PA-C  Chlorphen-Phenyleph-ASA (ALKA-SELTZER PLUS COLD PO) Take 2 tablets by mouth as needed (Cold symptoms).    [provider]  diclofenac sodium (VOLTAREN) 1 % GEL Apply 2 g 4 (four) times daily topically. Patient not taking: Reported on 12/08/2017 10/17/17   Aviva Kluver B, PA-C  ibuprofen (ADVIL,MOTRIN) 200 MG tablet Take 800 mg by mouth every 6 (six) hours as needed (for pain).     [provider]  methocarbamol (ROBAXIN) 500 MG tablet Take 2 tablets (1,000 mg total) by mouth every 6 (six) hours as needed for muscle spasms. 01/05/18   Pisciotta, Joni Reining, PA-C  predniSONE (DELTASONE) 20 MG tablet Two daily with food Patient not taking: Reported on 12/08/2017 08/15/17   Elvina Sidle, MD    Family History No family history on file.  Social History Social History   Tobacco Use  . Smoking status: Current Every Day Smoker    Packs/day: 0.50    Types: Cigarettes  . Smokeless tobacco: Never Used  Substance Use Topics  . Alcohol use: Yes    Comment: occ  . Drug use: No     Allergies   Patient has no known allergies.   Review of Systems Review of Systems  Musculoskeletal: Positive for arthralgias. Negative for joint swelling.  Skin: Negative for color change.  Allergic/Immunologic: Negative for immunocompromised state.  Neurological: Negative for weakness and numbness.  Psychiatric/Behavioral: Negative for  confusion.     Physical Exam Updated Vital Signs BP 106/72 (BP Location: Right Arm)   Pulse (!) 108   Temp 97.8 F (36.6 C) (Oral)   Resp 18   SpO2 100%   Physical Exam  Constitutional: She is oriented to person, place, and time. Vital signs are normal. She appears well-developed and well-nourished.  Non-toxic appearance. No distress.  Afebrile, nontoxic, NAD  HENT:  Head: Normocephalic and atraumatic.  Mouth/Throat: Mucous membranes are normal.  Eyes: Conjunctivae  and EOM are normal. Right eye exhibits no discharge. Left eye exhibits no discharge.  Neck: Normal range of motion. Neck supple.  Cardiovascular: Normal rate and intact distal pulses.  Tachycardia resolved on exam  Pulmonary/Chest: Effort normal. No respiratory distress.  Abdominal: Normal appearance. She exhibits no distension.  Musculoskeletal: Normal range of motion.       Right elbow: She exhibits normal range of motion, no swelling, no effusion, no deformity and no laceration. Tenderness found. Medial epicondyle tenderness noted.  R elbow with FROM intact, with mild TTP to medial epicondyle, no swelling or crepitus/deformity, no effusion, no bruising or erythema/warmth. Tapping of medial epicondyle reproduces paresthesias in fingers. Strength and sensation grossly intact, distal pulses intact, compartments soft.   Neurological: She is alert and oriented to person, place, and time. She has normal strength. No sensory deficit.  Skin: Skin is warm, dry and intact. No rash noted.  Psychiatric: She has a normal mood and affect. Her behavior is normal.  Nursing note and vitals reviewed.    ED Treatments / Results  Labs (all labs ordered are listed, but only abnormal results are displayed) Labs Reviewed - No data to display  EKG  EKG Interpretation None       Radiology No results found.  Procedures Procedures (including critical care time)  Medications Ordered in ED Medications - No data to display   Initial Impression / Assessment and Plan / ED Course  I have reviewed the triage vital signs and the nursing notes.  Pertinent labs & imaging results that were available during my care of the patient were reviewed by me and considered in my medical decision making (see chart for details).     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 Has some paresthesias in the fingertips diffusely.  On exam, mild medial epicondyle TTP, no swelling/bruising/erythema/warmth, no crepitus or deformity, FROM intact at the elbow and wrist/fingers, NVI with soft compartments, tapping at the medial epicondyle reproduces the paresthesias. Likely medial epicondylitis causing slight compression to nerve, advised rest, use of OTC brace/wrap to help with it, OTC remedies for symptom relief, and will rx mobic. Discussed tylenol use, ice/heat, and f/up with ortho in 1-2wks for ongoing management of this recurrent issue. Doubt need for further emergent work up at this time. I explained the diagnosis and have given explicit precautions to return to the ER including for any other new or worsening symptoms. The patient understands and accepts the medical plan as it's been dictated and I have answered their questions. Discharge instructions concerning home care and prescriptions have been given. The patient is STABLE and is discharged to home in good condition.    Final Clinical Impressions(s) / ED Diagnoses   Final diagnoses:  Medial epicondylitis of right elbow  Paresthesias in right hand    ED Discharge Orders        Ordered    meloxicam (MOBIC) 15 MG  tablet  Daily     02/01/18 [address] [place], [place] 02/01/18 1422    Maia Plan, MD 02/01/18 2016

## 2018-02-01 NOTE — Discharge Instructions (Signed)
You can consider getting an over the counter brace for your medial epicondylitis (golfer's elbow). Alternate between ice and heat to the area throughout the day, using ice or heat pack for no more than 20 minutes every hour for each.  You may also use the over the counter lidocaine patches if desired. Use mobic daily as directed, don't use additional NSAIDs like ibuprofen/aleve/etc while taking mobic. Use additional tylenol as needed for additional pain relief.  Follow up with the orthopedist in 1-2 weeks for recheck of symptoms and ongoing management of your issue. Return to the ER for changes or worsening symptoms.

## 2018-02-01 NOTE — ED Triage Notes (Signed)
Pt complains of right medial elbow pain for the past couple of days. 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 Pt needs work note to return.

## 2018-02-11 ENCOUNTER — Encounter (HOSPITAL_COMMUNITY): Payer: Self-pay

## 2018-02-11 ENCOUNTER — Other Ambulatory Visit: Payer: Self-pay

## 2018-02-11 ENCOUNTER — Emergency Department (HOSPITAL_COMMUNITY)
Admission: EM | Admit: 2018-02-11 | Discharge: 2018-02-11 | Disposition: A | Discharge status: Home / Self Care | Payer: Self-pay | Attending: Emergency Medicine | Admitting: Emergency Medicine

## 2018-02-11 DIAGNOSIS — F17210 Nicotine dependence, cigarettes, uncomplicated: Secondary | ICD-10-CM | POA: Insufficient documentation

## 2018-02-11 DIAGNOSIS — R21 Rash and other nonspecific skin eruption: Secondary | ICD-10-CM | POA: Insufficient documentation

## 2018-02-11 MED ORDER — CEPHALEXIN 250 MG PO CAPS: 250.0000 mg | ORAL_CAPSULE | Freq: Four times a day (QID) | ORAL | 0 refills | Status: DC

## 2018-02-11 NOTE — ED Notes (Signed)
ED Provider at bedside. 

## 2018-02-11 NOTE — ED Triage Notes (Signed)
Pt presents for evaluation of rash to bilateral legs and trunk/back x 2 weeks. Pt reports no itching or pain. Pt reports she did use daughters body wash but only once. Pt used benadryl with no improvement.

## 2018-02-11 NOTE — Discharge Instructions (Signed)
Take antibiotics as prescribed.  Take the entire course, even if your symptoms improve. Use lotion to make sure your skin is not dry.  Make sure the lotion is unscented. Do not use the body wash that may have caused this rash.  Use a new razor next time you shave. HIV and syphilis labs are pending.  You will receive a phone call in 3 days with results are positive.  If you do not receive a phone call, they are negative.  You may see the results on mychart.  Follow-up with the skin doctor if your symptoms are not improving. Return to the emergency room if you develop high fevers, blistering, or any new or concerning symptoms.

## 2018-02-11 NOTE — ED Provider Notes (Signed)
[facility] EMERGENCY DEPARTMENT Provider Note   CSN: [id number] Arrival date & time: 02/11/18  0807     History   Chief Complaint Chief Complaint  Patient presents with  . Rash    HPI [name] is a 42 y.o. female presenting for evaluation of a rash.   Pt states the rash began about 2 wks ago, after she used a new body wash and shaved her legs. It began as a single bump on her L ankle. Currently, rash is on bilateral legs, back, buttocks, vagina, and torso. No rash on arms face, scalp, palms, or feet. Rash does not itch, hurt, or have drainage. She denies h/o similar. She denies new foods, detergents, or environments. She has been taking benadryl without improvement. She is sexually active with 1 female partner. She denies vaginal discharge or urianry sxs. She denies fevers, URI sxs, CP, SOB, n/v, and pain. She has no other medical problems, doe snot take medications daily.   HPI  History reviewed. No pertinent past medical history.  There are no active problems to display for this patient.   Past Surgical History:  Procedure Laterality Date  . TUBAL LIGATION      OB History    No data available       Home Medications    Prior to Admission medications   Medication Sig Start Date End Date Taking? Authorizing Provider  amoxicillin (AMOXIL) 500 MG capsule Take 1 capsule (500 mg total) by mouth 3 (three) times daily. 12/08/17   Elson Areas, PA-C  cephALEXin (KEFLEX) 250 MG capsule Take 1 capsule (250 mg total) by mouth 4 (four) times daily. 02/11/18   Brix Brearley, PA-C  Chlorphen-Phenyleph-ASA (ALKA-SELTZER PLUS COLD PO) Take 2 tablets by mouth as needed (Cold symptoms).    [provider]  diclofenac sodium (VOLTAREN) 1 % GEL Apply 2 g 4 (four) times daily topically. Patient not taking: Reported on 12/08/2017 10/17/17   Aviva Kluver B, PA-C  ibuprofen (ADVIL,MOTRIN) 200 MG tablet Take 800 mg by mouth every 6 (six) hours as  needed (for pain).     [provider]  meloxicam (MOBIC) 15 MG tablet Take 1 tablet (15 mg total) by mouth daily. TAKE WITH MEALS 02/01/18   Street, [place], PA-C  methocarbamol (ROBAXIN) 500 MG tablet Take 2 tablets (1,000 mg total) by mouth every 6 (six) hours as needed for muscle spasms. 01/05/18   Pisciotta, Joni Reining, PA-C  predniSONE (DELTASONE) 20 MG tablet Two daily with food Patient not taking: Reported on 12/08/2017 08/15/17   Elvina Sidle, MD    Family History No family history on file.  Social History Social History   Tobacco Use  . Smoking status: Current Every Day Smoker    Packs/day: 0.50    Types: Cigarettes  . Smokeless tobacco: Never Used  Substance Use Topics  . Alcohol use: Yes    Comment: occ  . Drug use: No     Allergies   Patient has no known allergies.   Review of Systems Review of Systems  Constitutional: Negative for chills and fever.  Skin: Positive for rash.     Physical Exam Updated Vital Signs BP (!) 97/59   Pulse 77   Temp 98.2 F (36.8 C) (Oral)   Resp 17   Ht 5\' 2"  (1.575 m)   Wt 54.4 kg (120 lb)   LMP 01/25/2018 (Exact Date)   SpO2 99%   BMI 21.95 kg/m   Physical Exam  Constitutional:  She is oriented to person, place, and time. She appears well-developed and well-nourished. No distress.  HENT:  Head: Normocephalic and atraumatic.  Eyes: EOM are normal.  Neck: Normal range of motion.  Cardiovascular: Normal rate, regular rhythm and intact distal pulses.  Pulmonary/Chest: Effort normal and breath sounds normal. No respiratory distress. She has no wheezes.  Abdominal: She exhibits no distension.  Musculoskeletal: Normal range of motion.  Neurological: She is alert and oriented to person, place, and time.  Skin: Skin is warm. Rash noted.  Skin is very dry. Macular rash without pruritis or drainage on bilateral legs, below the breasts, and on low back. No rash on upper back, face, or palms. Rash is nonblanching,  nonpainful.   Psychiatric: She has a normal mood and affect.  Nursing note and vitals reviewed.    ED Treatments / Results  Labs (all labs ordered are listed, but only abnormal results are displayed) Labs Reviewed  HIV ANTIBODY (ROUTINE TESTING)  RPR    EKG  EKG Interpretation None       Radiology No results found.  Procedures Procedures (including critical care time)  Medications Ordered in ED Medications - No data to display   Initial Impression / Assessment and Plan / ED Course  I have reviewed the triage vital signs and the nursing notes.  Pertinent labs & imaging results that were available during my care of the patient were reviewed by me and considered in my medical decision making (see chart for details).     Pt presenting for evaluation of rash. She is afebrile and not tachycardic. Ne respiratory compromise. Doubt anaphylaxis, SJS, or TEN. Possible dermatitis from new body wash or folliculitis from razor. Will obtain hiv and rpr due to vaginal involvement. Pt given information for f/u with dermatology. Keflex for possible folliculitis. At this time, pt appears safe for d/c. Return precautions given. Pt states she understands and agrees to plan.   Final Clinical Impressions(s) / ED Diagnoses   Final diagnoses:  Rash    ED Discharge Orders        Ordered    cephALEXin (KEFLEX) 250 MG capsule  4 times daily     02/11/18 1044       Moe Brier, PA-C 02/11/18 1228    Little, Ambrose Finlandachel Morgan, MD 02/11/18 1704

## 2018-02-11 NOTE — ED Notes (Signed)
Pt verbalized understanding of discharge instructions and denies any further questions at this time.   

## 2018-02-12 LAB — HIV ANTIBODY (ROUTINE TESTING W REFLEX): HIV Screen 4th Generation wRfx: NONREACTIVE

## 2018-02-12 LAB — RPR: RPR Ser Ql: NONREACTIVE

## 2018-02-27 ENCOUNTER — Emergency Department (HOSPITAL_COMMUNITY)
Admission: EM | Admit: 2018-02-27 | Discharge: 2018-02-27 | Disposition: A | Discharge status: Home / Self Care | Payer: No Typology Code available for payment source | Attending: Emergency Medicine | Admitting: Emergency Medicine

## 2018-02-27 ENCOUNTER — Emergency Department (HOSPITAL_COMMUNITY): Payer: No Typology Code available for payment source

## 2018-02-27 ENCOUNTER — Encounter (HOSPITAL_COMMUNITY): Payer: Self-pay | Admitting: Emergency Medicine

## 2018-02-27 DIAGNOSIS — Y92410 Unspecified street and highway as the place of occurrence of the external cause: Secondary | ICD-10-CM | POA: Insufficient documentation

## 2018-02-27 DIAGNOSIS — Y998 Other external cause status: Secondary | ICD-10-CM | POA: Diagnosis not present

## 2018-02-27 DIAGNOSIS — Z79899 Other long term (current) drug therapy: Secondary | ICD-10-CM | POA: Diagnosis not present

## 2018-02-27 DIAGNOSIS — S0001XA Abrasion of scalp, initial encounter: Secondary | ICD-10-CM

## 2018-02-27 DIAGNOSIS — Y9389 Activity, other specified: Secondary | ICD-10-CM | POA: Insufficient documentation

## 2018-02-27 DIAGNOSIS — M25511 Pain in right shoulder: Secondary | ICD-10-CM

## 2018-02-27 DIAGNOSIS — S0990XA Unspecified injury of head, initial encounter: Secondary | ICD-10-CM | POA: Diagnosis present

## 2018-02-27 DIAGNOSIS — F17210 Nicotine dependence, cigarettes, uncomplicated: Secondary | ICD-10-CM | POA: Diagnosis not present

## 2018-02-27 DIAGNOSIS — S0003XA Contusion of scalp, initial encounter: Secondary | ICD-10-CM

## 2018-02-27 DIAGNOSIS — S01111A Laceration without foreign body of right eyelid and periocular area, initial encounter: Secondary | ICD-10-CM | POA: Diagnosis not present

## 2018-02-27 DIAGNOSIS — Z23 Encounter for immunization: Secondary | ICD-10-CM | POA: Diagnosis not present

## 2018-02-27 LAB — I-STAT CHEM 8, ED
BUN: 11 mg/dL (ref 6–20)
CHLORIDE: 109 mmol/L (ref 101–111)
Calcium, Ion: 1.04 mmol/L — ABNORMAL LOW (ref 1.15–1.40)
Creatinine, Ser: 0.8 mg/dL (ref 0.44–1.00)
Glucose, Bld: 98 mg/dL (ref 65–99)
HEMATOCRIT: 38 % (ref 36.0–46.0)
Hemoglobin: 12.9 g/dL (ref 12.0–15.0)
Potassium: 3.8 mmol/L (ref 3.5–5.1)
SODIUM: 144 mmol/L (ref 135–145)
TCO2: 23 mmol/L (ref 22–32)

## 2018-02-27 LAB — I-STAT BETA HCG BLOOD, ED (MC, WL, AP ONLY): I-stat hCG, quantitative: <5 m[IU]/mL (ref <5)

## 2018-02-27 LAB — ETHANOL: Alcohol, Ethyl (B): 138 mg/dL — ABNORMAL HIGH (ref <10)

## 2018-02-27 MED ORDER — NAPROXEN 500 MG PO TABS: 500.0000 mg | ORAL_TABLET | Freq: Two times a day (BID) | ORAL | 0 refills | Status: DC

## 2018-02-27 MED ORDER — LIDOCAINE-EPINEPHRINE (PF) 2 %-1:200000 IJ SOLN
20.0000 mL | Freq: Once | INTRAMUSCULAR | Status: AC
  Administered 2018-02-27: 20 mL
  Filled 2018-02-27: qty 20

## 2018-02-27 MED ORDER — KETOROLAC TROMETHAMINE 30 MG/ML IJ SOLN
30.0000 mg | Freq: Once | INTRAMUSCULAR | Status: AC
  Administered 2018-02-27: 30 mg via INTRAVENOUS
  Filled 2018-02-27: qty 1

## 2018-02-27 MED ORDER — METHOCARBAMOL 500 MG PO TABS: 500.0000 mg | ORAL_TABLET | Freq: Three times a day (TID) | ORAL | 0 refills | Status: DC | PRN

## 2018-02-27 MED ORDER — TETANUS-DIPHTH-ACELL PERTUSSIS 5-2.5-18.5 LF-MCG/0.5 IM SUSP
0.5000 mL | Freq: Once | INTRAMUSCULAR | Status: AC
  Administered 2018-02-27: 0.5 mL via INTRAMUSCULAR
  Filled 2018-02-27: qty 0.5

## 2018-02-27 NOTE — ED Notes (Signed)
Family member given work note

## 2018-02-27 NOTE — ED Notes (Signed)
[employer] at bedside 
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
## 2018-02-27 NOTE — ED Provider Notes (Signed)
Laceration repair performed by me at the request of Marcos EkeKelly Hume, PA-C  LACERATION REPAIR Performed by: Fayrene HelperBowie Railee Bonillas Authorized by: Fayrene HelperBowie Jasiel Belisle Consent: Verbal consent obtained. Risks and benefits: risks, benefits and alternatives were discussed Consent given by: patient Patient identity confirmed: provided demographic data Prepped and Draped in normal sterile fashion Wound explored  Laceration Location: R mid eyebrow  Laceration Length: 1cm  No Foreign Bodies seen or palpated  Anesthesia: local infiltration  Local anesthetic: lidocaine 2% w epinephrine  Anesthetic total: 1 ml  Irrigation method: syringe Amount of cleaning: standard  Skin closure: vicryl 6.0  Number of sutures: 3  Technique: simple interrupted  Patient tolerance: Patient tolerated the procedure well with no immediate complications.  Sutures are dissolvable.  Wound care instruction given.    Fayrene Helperran, Tanya Kurtenbach, PA-C 02/27/18 [id number]    Derwood KaplanNanavati, Ankit, MD 02/27/18 [phone]
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
## 2018-03-03 ENCOUNTER — Emergency Department (HOSPITAL_COMMUNITY): Payer: No Typology Code available for payment source

## 2018-03-03 ENCOUNTER — Emergency Department (HOSPITAL_COMMUNITY)
Admission: EM | Admit: 2018-03-03 | Discharge: 2018-03-03 | Disposition: A | Discharge status: Home / Self Care | Payer: No Typology Code available for payment source | Attending: Emergency Medicine | Admitting: Emergency Medicine

## 2018-03-03 ENCOUNTER — Encounter (HOSPITAL_COMMUNITY): Payer: Self-pay | Admitting: Emergency Medicine

## 2018-03-03 DIAGNOSIS — Y999 Unspecified external cause status: Secondary | ICD-10-CM | POA: Diagnosis not present

## 2018-03-03 DIAGNOSIS — Z79899 Other long term (current) drug therapy: Secondary | ICD-10-CM | POA: Diagnosis not present

## 2018-03-03 DIAGNOSIS — S4991XA Unspecified injury of right shoulder and upper arm, initial encounter: Secondary | ICD-10-CM | POA: Diagnosis present

## 2018-03-03 DIAGNOSIS — Y939 Activity, unspecified: Secondary | ICD-10-CM | POA: Insufficient documentation

## 2018-03-03 DIAGNOSIS — F17210 Nicotine dependence, cigarettes, uncomplicated: Secondary | ICD-10-CM | POA: Insufficient documentation

## 2018-03-03 DIAGNOSIS — Y929 Unspecified place or not applicable: Secondary | ICD-10-CM | POA: Diagnosis not present

## 2018-03-03 DIAGNOSIS — S43101A Unspecified dislocation of right acromioclavicular joint, initial encounter: Secondary | ICD-10-CM | POA: Diagnosis not present

## 2018-03-03 MED ORDER — OXYCODONE-ACETAMINOPHEN 5-325 MG PO TABS
1.0000 | ORAL_TABLET | Freq: Once | ORAL | Status: AC
  Administered 2018-03-03: 1 via ORAL
  Filled 2018-03-03: qty 1

## 2018-03-03 MED ORDER — OXYCODONE-ACETAMINOPHEN 5-325 MG PO TABS: 1.0000 | ORAL_TABLET | ORAL | 0 refills | Status: DC | PRN

## 2018-03-03 NOTE — Discharge Instructions (Signed)
You have a mild AC joint separation.  As we discussed please follow up with orthopedic doctor.   I have written you a prescription for a short course of pain medication.  This medicine can make you drowsy so please do not drive or work while taking it. Continue taking naproxen for your pain.  Ice shoulder.
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
## 2018-03-15 ENCOUNTER — Emergency Department (HOSPITAL_COMMUNITY)
Admission: EM | Admit: 2018-03-15 | Discharge: 2018-03-15 | Disposition: A | Discharge status: Home / Self Care | Payer: Self-pay | Attending: Physician Assistant | Admitting: Physician Assistant

## 2018-03-15 ENCOUNTER — Encounter (HOSPITAL_COMMUNITY): Payer: Self-pay | Admitting: Emergency Medicine

## 2018-03-15 ENCOUNTER — Emergency Department (HOSPITAL_COMMUNITY): Payer: Self-pay

## 2018-03-15 DIAGNOSIS — Z79899 Other long term (current) drug therapy: Secondary | ICD-10-CM | POA: Insufficient documentation

## 2018-03-15 DIAGNOSIS — R1030 Lower abdominal pain, unspecified: Secondary | ICD-10-CM | POA: Insufficient documentation

## 2018-03-15 DIAGNOSIS — F17210 Nicotine dependence, cigarettes, uncomplicated: Secondary | ICD-10-CM | POA: Insufficient documentation

## 2018-03-15 DIAGNOSIS — Z202 Contact with and (suspected) exposure to infections with a predominantly sexual mode of transmission: Secondary | ICD-10-CM | POA: Insufficient documentation

## 2018-03-15 DIAGNOSIS — N10 Acute tubulo-interstitial nephritis: Secondary | ICD-10-CM | POA: Insufficient documentation

## 2018-03-15 DIAGNOSIS — Z711 Person with feared health complaint in whom no diagnosis is made: Secondary | ICD-10-CM

## 2018-03-15 DIAGNOSIS — A599 Trichomoniasis, unspecified: Secondary | ICD-10-CM | POA: Insufficient documentation

## 2018-03-15 DIAGNOSIS — N12 Tubulo-interstitial nephritis, not specified as acute or chronic: Secondary | ICD-10-CM

## 2018-03-15 DIAGNOSIS — G43909 Migraine, unspecified, not intractable, without status migrainosus: Secondary | ICD-10-CM | POA: Insufficient documentation

## 2018-03-15 DIAGNOSIS — G43009 Migraine without aura, not intractable, without status migrainosus: Secondary | ICD-10-CM

## 2018-03-15 LAB — URINALYSIS, ROUTINE W REFLEX MICROSCOPIC
Bilirubin Urine: NEGATIVE
Glucose, UA: NEGATIVE mg/dL
Ketones, ur: NEGATIVE mg/dL
Nitrite: POSITIVE — AB
PH: 5 (ref 5.0–8.0)
Protein, ur: 100 mg/dL — AB
SPECIFIC GRAVITY, URINE: 1.012 (ref 1.005–1.030)

## 2018-03-15 LAB — CBC
HEMATOCRIT: 36.9 % (ref 36.0–46.0)
Hemoglobin: 12.7 g/dL (ref 12.0–15.0)
MCH: 30.2 pg (ref 26.0–34.0)
MCHC: 34.4 g/dL (ref 30.0–36.0)
MCV: 87.9 fL (ref 78.0–100.0)
PLATELETS: 216 10*3/uL (ref 150–400)
RBC: 4.2 MIL/uL (ref 3.87–5.11)
RDW: 13 % (ref 11.5–15.5)
WBC: 22.1 10*3/uL — ABNORMAL HIGH (ref 4.0–10.5)

## 2018-03-15 LAB — COMPREHENSIVE METABOLIC PANEL
ALT: 16 U/L (ref 14–54)
AST: 20 U/L (ref 15–41)
Albumin: 3.5 g/dL (ref 3.5–5.0)
Alkaline Phosphatase: 104 U/L (ref 38–126)
Anion gap: 12 (ref 5–15)
BILIRUBIN TOTAL: 0.5 mg/dL (ref 0.3–1.2)
BUN: 10 mg/dL (ref 6–20)
CHLORIDE: 102 mmol/L (ref 101–111)
CO2: 21 mmol/L — ABNORMAL LOW (ref 22–32)
CREATININE: 0.79 mg/dL (ref 0.44–1.00)
Calcium: 8.8 mg/dL — ABNORMAL LOW (ref 8.9–10.3)
Glucose, Bld: 123 mg/dL — ABNORMAL HIGH (ref 65–99)
POTASSIUM: 3.2 mmol/L — AB (ref 3.5–5.1)
Sodium: 135 mmol/L (ref 135–145)
TOTAL PROTEIN: 7.4 g/dL (ref 6.5–8.1)

## 2018-03-15 LAB — WET PREP, GENITAL
SPERM: NONE SEEN
Yeast Wet Prep HPF POC: NONE SEEN

## 2018-03-15 LAB — I-STAT BETA HCG BLOOD, ED (MC, WL, AP ONLY)

## 2018-03-15 LAB — LIPASE, BLOOD: LIPASE: 25 U/L (ref 11–51)

## 2018-03-15 MED ORDER — METRONIDAZOLE 500 MG PO TABS
2000.0000 mg | ORAL_TABLET | Freq: Once | ORAL | Status: AC
  Administered 2018-03-15: 2000 mg via ORAL
  Filled 2018-03-15: qty 4

## 2018-03-15 MED ORDER — PROCHLORPERAZINE EDISYLATE 5 MG/ML IJ SOLN
10.0000 mg | Freq: Once | INTRAMUSCULAR | Status: AC
  Administered 2018-03-15: 10 mg via INTRAVENOUS
  Filled 2018-03-15: qty 2

## 2018-03-15 MED ORDER — SODIUM CHLORIDE 0.9 % IV BOLUS
1000.0000 mL | Freq: Once | INTRAVENOUS | Status: AC
  Administered 2018-03-15: 1000 mL via INTRAVENOUS

## 2018-03-15 MED ORDER — GI COCKTAIL ~~LOC~~: 30.0000 mL | Freq: Once | ORAL | Status: DC

## 2018-03-15 MED ORDER — DIPHENHYDRAMINE HCL 50 MG/ML IJ SOLN
12.5000 mg | Freq: Once | INTRAMUSCULAR | Status: AC
  Administered 2018-03-15: 12.5 mg via INTRAVENOUS
  Filled 2018-03-15: qty 1

## 2018-03-15 MED ORDER — CEPHALEXIN 500 MG PO CAPS: 500.0000 mg | ORAL_CAPSULE | Freq: Four times a day (QID) | ORAL | 0 refills | Status: AC

## 2018-03-15 MED ORDER — DIPHENHYDRAMINE HCL 50 MG/ML IJ SOLN: 12.5000 mg | Freq: Once | INTRAMUSCULAR | Status: DC

## 2018-03-15 MED ORDER — CEFTRIAXONE SODIUM 1 G IJ SOLR
1.0000 g | Freq: Once | INTRAMUSCULAR | Status: AC
  Administered 2018-03-15: 1 g via INTRAVENOUS
  Filled 2018-03-15: qty 10

## 2018-03-15 MED ORDER — IOPAMIDOL (ISOVUE-300) INJECTION 61%
INTRAVENOUS | Status: AC
  Filled 2018-03-15: qty 100

## 2018-03-15 MED ORDER — PROCHLORPERAZINE EDISYLATE 5 MG/ML IJ SOLN: 10.0000 mg | Freq: Once | INTRAMUSCULAR | Status: DC

## 2018-03-15 MED ORDER — IOPAMIDOL (ISOVUE-300) INJECTION 61%
100.0000 mL | Freq: Once | INTRAVENOUS | Status: AC | PRN
  Administered 2018-03-15: 100 mL via INTRAVENOUS

## 2018-03-15 NOTE — ED Triage Notes (Signed)
Pt reports had headaches since yesterday upon awaking. Reports lower abd today. Reports that her significant other went to health department and was treated for STD so she needs to be treating. No vaginal discharge, pains or odors.

## 2018-03-15 NOTE — Discharge Instructions (Signed)
Please complete the entire course of antibiotics regardless of symptom improvement to prevent worsening or return of your infection. We will contact you with the results of your remaining lab work when it is available. [facility] for further evaluation and to establish care.

## 2018-03-15 NOTE — ED Provider Notes (Signed)
[facility]-EMERGENCY DEPT Provider Note   CSN: [id number] Arrival date & time: 03/15/18  4098     History   Chief Complaint Chief Complaint  Patient presents with  . Abdominal Pain  . Headache  . STD testing    HPI [name] is a 42 y.o. female who presents to ED for evaluation of multiple complaints.  Her first complaint is migraine type headache since yesterday.  She reports a generalized headache with associated photophobia, phonophobia and nausea.  She has tried previously prescribed Percocet and naproxen with no improvement in her symptoms.  She denies any head injuries or falls, numbness in arms or legs, vision changes, fevers, neck pain. Her next complaint is abdominal pain and feelings of heartburn.  She states that anytime she tried to eat anything yesterday she felt like she was "belching it up."  She also reports lower abdominal discomfort and dark colored urine.  She denies any vomiting, changes in bowel movements, dysuria, hematuria. Patient's next complaint is possible exposure to STD.  She states that her ex-partner of 15 years told her that he had gonorrhea.  She denies any vaginal discharge, abnormal vaginal bleeding, prior history of STDs.  HPI  History reviewed. No pertinent past medical history.  There are no active problems to display for this patient.   Past Surgical History:  Procedure Laterality Date  . TUBAL LIGATION       OB History   None      Home Medications    Prior to Admission medications   Medication Sig Start Date End Date Taking? Authorizing Provider  amoxicillin (AMOXIL) 500 MG capsule Take 1 capsule (500 mg total) by mouth 3 (three) times daily. 12/08/17   Elson Areas, PA-C  cephALEXin (KEFLEX) 500 MG capsule Take 1 capsule (500 mg total) by mouth 4 (four) times daily for 9 days. 03/15/18 03/24/18  Reilley Latorre, PA-C  Chlorphen-Phenyleph-ASA (ALKA-SELTZER PLUS COLD PO) Take 2 tablets by mouth as needed  (Cold symptoms).    [provider]  diclofenac sodium (VOLTAREN) 1 % GEL Apply 2 g 4 (four) times daily topically. Patient not taking: Reported on 12/08/2017 10/17/17   Aviva Kluver B, PA-C  ibuprofen (ADVIL,MOTRIN) 200 MG tablet Take 800 mg by mouth every 6 (six) hours as needed (for pain).     [provider]  meloxicam (MOBIC) 15 MG tablet Take 1 tablet (15 mg total) by mouth daily. TAKE WITH MEALS 02/01/18   Street, [place], PA-C  methocarbamol (ROBAXIN) 500 MG tablet Take 1 tablet (500 mg total) by mouth every 8 (eight) hours as needed for muscle spasms. 02/27/18   Antony Madura, PA-C  naproxen (NAPROSYN) 500 MG tablet Take 1 tablet (500 mg total) by mouth 2 (two) times daily. 02/27/18   Antony Madura, PA-C  oxyCODONE-acetaminophen (PERCOCET/ROXICET) 5-325 MG tablet Take 1 tablet by mouth every 4 (four) hours as needed for severe pain. 03/03/18   Kellie Shropshire, PA-C  predniSONE (DELTASONE) 20 MG tablet Two daily with food Patient not taking: Reported on 12/08/2017 08/15/17   Elvina Sidle, MD    Family History No family history on file.  Social History Social History   Tobacco Use  . Smoking status: Current Every Day Smoker    Packs/day: 0.50    Types: Cigarettes  . Smokeless tobacco: Never Used  Substance Use Topics  . Alcohol use: Yes    Comment: occ  . Drug use: No     Allergies  Patient has no known allergies.   Review of Systems Review of Systems  Constitutional: Negative for appetite change, chills and fever.  HENT: Negative for ear pain, rhinorrhea, sneezing and sore throat.   Eyes: Positive for photophobia. Negative for visual disturbance.  Respiratory: Negative for cough, chest tightness, shortness of breath and wheezing.   Cardiovascular: Negative for chest pain and palpitations.  Gastrointestinal: Positive for abdominal pain and nausea. Negative for blood in stool, constipation, diarrhea and vomiting.  Genitourinary: Negative for dysuria,  hematuria, urgency, vaginal bleeding and vaginal discharge.  Musculoskeletal: Negative for myalgias.  Skin: Negative for rash.  Neurological: Positive for headaches. Negative for dizziness, weakness and light-headedness.     Physical Exam Updated Vital Signs BP 99/61 (BP Location: Left Arm)   Pulse 81   Temp 97.7 F (36.5 C) (Oral)   Resp 15   Ht 5\' 2"  (1.575 m)   Wt 55.3 kg (122 lb)   LMP 02/19/2018 (Approximate) Comment: neg hcg  SpO2 99%   BMI 22.31 kg/m   Physical Exam  Constitutional: She is oriented to person, place, and time. She appears well-developed and well-nourished. No distress.  Nontoxic appearing and in no acute distress.  HENT:  Head: Normocephalic and atraumatic.  Nose: Nose normal.  Eyes: Conjunctivae and EOM are normal. Right eye exhibits no discharge. Left eye exhibits no discharge. No scleral icterus.  Neck: Normal range of motion. Neck supple.  Cardiovascular: Normal rate, regular rhythm, normal heart sounds and intact distal pulses. Exam reveals no gallop and no friction rub.  No murmur heard. Pulmonary/Chest: Effort normal and breath sounds normal. No respiratory distress.  Abdominal: Soft. Bowel sounds are normal. She exhibits no distension. There is tenderness in the right lower quadrant, epigastric area and left lower quadrant. There is no guarding.  Genitourinary: There is no tenderness on the right labia. There is no tenderness on the left labia. Cervix exhibits no motion tenderness. Right adnexum displays tenderness. Right adnexum displays no mass. Left adnexum displays tenderness. Left adnexum displays no mass. Vaginal discharge found.    Genitourinary Comments: Pelvic exam: normal external genitalia without evidence of trauma. VULVA: normal appearing vulva with no masses, tenderness or lesion. VAGINA: normal appearing vagina with normal color and discharge, no lesions. CERVIX: cervix with white tissue on 2:00 position, cervical motion tenderness  absent, cervical os closed with out purulent discharge; No vaginal discharge. Wet prep and DNA probe for chlamydia and GC obtained.   ADNEXA: normal adnexa in size, no masses UTERUS: uterus is normal size, shape, consistency and nontender.    Musculoskeletal: Normal range of motion. She exhibits no edema.  Neurological: She is alert and oriented to person, place, and time. No cranial nerve deficit or sensory deficit. She exhibits normal muscle tone. Coordination normal.  Pupils reactive. No facial asymmetry noted. Cranial nerves appear grossly intact. Sensation intact to light touch on face, BUE and BLE. Strength 5/5 in BUE and BLE. Normal finger to nose coordination.  Skin: Skin is warm and dry. No rash noted.  Psychiatric: She has a normal mood and affect.  Nursing note and vitals reviewed.    ED Treatments / Results  Labs (all labs ordered are listed, but only abnormal results are displayed) Labs Reviewed  WET PREP, GENITAL - Abnormal; Notable for the following components:      Result Value   Trich, Wet Prep PRESENT (*)    Clue Cells Wet Prep HPF POC PRESENT (*)    WBC, Wet Prep HPF POC  MODERATE (*)    All other components within normal limits  COMPREHENSIVE METABOLIC PANEL - Abnormal; Notable for the following components:   Potassium 3.2 (*)    CO2 21 (*)    Glucose, Bld 123 (*)    Calcium 8.8 (*)    All other components within normal limits  CBC - Abnormal; Notable for the following components:   WBC 22.1 (*)    All other components within normal limits  URINALYSIS, ROUTINE W REFLEX MICROSCOPIC - Abnormal; Notable for the following components:   Color, Urine AMBER (*)    APPearance TURBID (*)    Hgb urine dipstick MODERATE (*)    Protein, ur 100 (*)    Nitrite POSITIVE (*)    Leukocytes, UA LARGE (*)    Bacteria, UA MANY (*)    Squamous Epithelial / LPF 6-30 (*)    Non Squamous Epithelial 0-5 (*)    All other components within normal limits  LIPASE, BLOOD  HIV ANTIBODY  (ROUTINE TESTING)  RPR  I-STAT BETA HCG BLOOD, ED ([facility], [facility], [facility] ONLY)  GC/CHLAMYDIA PROBE AMP ([place]) NOT AT [facility]    EKG None  Radiology Ct Abdomen Pelvis W Contrast  Result Date: 03/15/2018 CLINICAL DATA:  Lower abdominal pain for 1 day. EXAM: CT ABDOMEN AND PELVIS WITH CONTRAST TECHNIQUE: Multidetector CT imaging of the abdomen and pelvis was performed using the standard protocol following bolus administration of intravenous contrast. CONTRAST:  ISOVUE-300 IOPAMIDOL (ISOVUE-300) INJECTION 61% COMPARISON:  None. FINDINGS: Lower Chest: No acute findings. Hepatobiliary: No hepatic masses identified. Gallbladder is unremarkable. Pancreas:  No mass or inflammatory changes. Spleen: Within normal limits in size and appearance. Adrenals/Urinary Tract: Right renal swelling and mild perinephric stranding is seen with multiple patchy areas of decreased contrast enhancement throughout the right kidney. There are also several subtle areas of decreased parenchymal enhancement in the upper and lower poles of the left kidney. These findings are consistent with pyelonephritis. No evidence of renal abscess. No evidence of renal mass or hydronephrosis. Unremarkable unopacified urinary bladder. Stomach/Bowel: No evidence of obstruction, inflammatory process or abnormal fluid collections. Vascular/Lymphatic: No pathologically enlarged lymph nodes. No abdominal aortic aneurysm. Reproductive:  No mass or other significant abnormality. Other:  None. Musculoskeletal:  No suspicious bone lesions identified. IMPRESSION: Bilateral pyelonephritis, right side greater than left. No evidence of renal abscess or hydronephrosis. Electronically Signed   By: Myles Rosenthal M.D.   On: 03/15/2018 11:44    Procedures Procedures (including critical care time)  Medications Ordered in ED Medications  iopamidol (ISOVUE-300) 61 % injection (has no administration in time range)  sodium chloride 0.9 % bolus 1,000 mL (0 mLs  Intravenous Stopped 03/15/18 1158)  iopamidol (ISOVUE-300) 61 % injection 100 mL (100 mLs Intravenous Contrast Given 03/15/18 1119)  prochlorperazine (COMPAZINE) injection 10 mg (10 mg Intravenous Given 03/15/18 1321)  diphenhydrAMINE (BENADRYL) injection 12.5 mg (12.5 mg Intravenous Given 03/15/18 1321)  cefTRIAXone (ROCEPHIN) 1 g in sodium chloride 0.9 % 100 mL IVPB (1 g Intravenous New Bag/Given 03/15/18 1325)  metroNIDAZOLE (FLAGYL) tablet 2,000 mg (2,000 mg Oral Given 03/15/18 1321)     Initial Impression / Assessment and Plan / ED Course  I have reviewed the triage vital signs and the nursing notes.  Pertinent labs & imaging results that were available during my care of the patient were reviewed by me and considered in my medical decision making (see chart for details).     Patient presents to ED for evaluation of multiple  complaints.  Her first complaint is migraine type headache since yesterday.  She reports associated photophobia, phonophobia and nausea.  She has no deficits on her neurological examination so I suspect migraine headaches since she has a history of them in the past.  Reports improvement with migraine cocktail. There are no headache characteristics that are lateralizing or concerning for increased ICP, infectious or vascular cause of his symptoms.   Her next complaint is lower abdominal pain and feelings of heartburn.  She is concerned for possible STD exposure.  She denies any vaginal discharge but does report dark colored urine.  Lab was significant for leukocytosis at 22.  Urinalysis with evidence of UTI with positive nitrites, leukocytes, bacteria.  CT of the abdomen and pelvis shows bilateral pyelonephritis.  Wet prep shows trichomonas, clue cells.  CMP, lipase unremarkable.  HCG negative.  Remainder of STD panel is pending at this time.  Patient was given Rocephin and Flagyl for infection.  Will discharge home with remainder of antibiotic course for pyelo. Afebrile with no hx  of fever. VSS. Patient declines empiric gonorrhea and Chlamydia treatment until results return. Advised to return for any severe or worsening symptoms.  Portions of this note were generated with Scientist, clinical (histocompatibility and immunogenetics). Dictation errors may occur despite best attempts at proofreading.   Final Clinical Impressions(s) / ED Diagnoses   Final diagnoses:  Pyelonephritis  Trichomoniasis  Migraine without aura and without status migrainosus, not intractable  Concern about STD in female without diagnosis    ED Discharge Orders        Ordered    cephALEXin (KEFLEX) 500 MG capsule  4 times daily     03/15/18 1403       Dietrich Pates, PA-C 03/15/18 1409    Abelino Derrick, MD 03/16/18 1525

## 2018-03-16 LAB — HIV ANTIBODY (ROUTINE TESTING W REFLEX): HIV Screen 4th Generation wRfx: NONREACTIVE

## 2018-03-16 LAB — RPR: RPR Ser Ql: NONREACTIVE

## 2018-03-16 LAB — GC/CHLAMYDIA PROBE AMP (~~LOC~~) NOT AT ARMC
CHLAMYDIA, DNA PROBE: NEGATIVE
Neisseria Gonorrhea: POSITIVE — AB

## 2018-03-27 ENCOUNTER — Ambulatory Visit (HOSPITAL_COMMUNITY)
Admission: EM | Admit: 2018-03-27 | Discharge: 2018-03-27 | Disposition: A | Discharge status: Home / Self Care | Payer: Self-pay | Attending: Family Medicine | Admitting: Family Medicine

## 2018-03-27 ENCOUNTER — Encounter (HOSPITAL_COMMUNITY): Payer: Self-pay | Admitting: Emergency Medicine

## 2018-03-27 DIAGNOSIS — M25511 Pain in right shoulder: Secondary | ICD-10-CM

## 2018-03-27 MED ORDER — KETOROLAC TROMETHAMINE 60 MG/2ML IM SOLN
60.0000 mg | Freq: Once | INTRAMUSCULAR | Status: AC
  Administered 2018-03-27: 60 mg via INTRAMUSCULAR

## 2018-03-27 MED ORDER — KETOROLAC TROMETHAMINE 60 MG/2ML IM SOLN
INTRAMUSCULAR | Status: AC
  Filled 2018-03-27: qty 2

## 2018-03-27 MED ORDER — CYCLOBENZAPRINE HCL 5 MG PO TABS: 5.0000 mg | ORAL_TABLET | Freq: Every day | ORAL | 0 refills | Status: DC
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
## 2018-03-27 NOTE — Discharge Instructions (Addendum)
Ice application to shoulder as well as twice a day naproxen to help with pain. See provided range of motion exercises. Limit weight lifting to <25 lbs for the next three days  Please follow up with occupational health next week if further work restrictions are needed. May follow up with orthopedics as needed for persistent pain.
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
## 2018-05-06 ENCOUNTER — Encounter (HOSPITAL_COMMUNITY): Payer: Self-pay | Admitting: Emergency Medicine

## 2018-05-06 ENCOUNTER — Emergency Department (HOSPITAL_COMMUNITY)
Admission: EM | Admit: 2018-05-06 | Discharge: 2018-05-06 | Disposition: A | Discharge status: Home / Self Care | Payer: Self-pay | Attending: Emergency Medicine | Admitting: Emergency Medicine

## 2018-05-06 DIAGNOSIS — J0100 Acute maxillary sinusitis, unspecified: Secondary | ICD-10-CM | POA: Insufficient documentation

## 2018-05-06 DIAGNOSIS — F17210 Nicotine dependence, cigarettes, uncomplicated: Secondary | ICD-10-CM | POA: Insufficient documentation

## 2018-05-06 MED ORDER — AMOXICILLIN 500 MG PO CAPS: 500.0000 mg | ORAL_CAPSULE | Freq: Three times a day (TID) | ORAL | 0 refills | Status: DC

## 2018-05-06 NOTE — ED Provider Notes (Signed)
[facility]-EMERGENCY DEPT Provider Note   CSN: [id number] Arrival date & time: 05/06/18  1535     History   Chief Complaint Chief Complaint  Patient presents with  . Sinusitis    HPI [name] is a 42 y.o. female every day smoker who presents to the ED with sinus pressure and pain. The pain started 4 days ago and has gotten worse. Patient has been taking OTC medications for sinus without relief.   The history is provided by the patient. No language interpreter was used.  Sinusitis   This is a new problem. The current episode started more than 2 days ago. The problem has been gradually worsening. There has been no fever. The pain is moderate. The pain has been constant since onset. Associated symptoms include congestion and sinus pressure. Pertinent negatives include no chills, no sore throat, no cough and no shortness of breath. She has tried acetaminophen for the symptoms.    No past medical history on file.  There are no active problems to display for this patient.   Past Surgical History:  Procedure Laterality Date  . TUBAL LIGATION       OB History   None      Home Medications    Prior to Admission medications   Medication Sig Start Date End Date Taking? Authorizing Provider  amoxicillin (AMOXIL) 500 MG capsule Take 1 capsule (500 mg total) by mouth 3 (three) times daily. 05/06/18   Janne Napoleon, NP  Chlorphen-Phenyleph-ASA (ALKA-SELTZER PLUS COLD PO) Take 2 tablets by mouth as needed (Cold symptoms).    [provider]  cyclobenzaprine (FLEXERIL) 5 MG tablet Take 1 tablet (5 mg total) by mouth at bedtime. 03/27/18   Georgetta Haber, NP    Family History No family history on file.  Social History Social History   Tobacco Use  . Smoking status: Current Every Day Smoker    Packs/day: 0.50    Types: Cigarettes  . Smokeless tobacco: Never Used  Substance Use Topics  . Alcohol use: Yes    Comment: occ  . Drug use: No      Allergies   Patient has no known allergies.   Review of Systems Review of Systems  Constitutional: Negative for chills and fever.  HENT: Positive for congestion, sinus pressure and sinus pain. Negative for facial swelling, sore throat and trouble swallowing.   Eyes: Negative for pain and redness.  Respiratory: Negative for cough and shortness of breath.   Cardiovascular: Negative for chest pain.  Gastrointestinal: Negative for abdominal pain, nausea and vomiting.  Musculoskeletal: Positive for myalgias.  Skin: Negative for rash.  Neurological: Positive for headaches.  Hematological: Negative for adenopathy.  Psychiatric/Behavioral: Negative for confusion.     Physical Exam Updated Vital Signs BP 118/89 (BP Location: Left Arm)   Pulse 80   Temp 98 F (36.7 C) (Oral)   Resp 18   SpO2 100%   Physical Exam  Constitutional: She is oriented to person, place, and time. She appears well-developed and well-nourished. No distress.  HENT:  Head: Normocephalic.  Right Ear: Tympanic membrane normal.  Left Ear: Tympanic membrane normal.  Nose: Mucosal edema present. Right sinus exhibits maxillary sinus tenderness. Left sinus exhibits maxillary sinus tenderness.  Mouth/Throat: Uvula is midline and mucous membranes are normal. No posterior oropharyngeal edema.  Eyes: EOM are normal.  Neck: Neck supple.  Pulmonary/Chest: Effort normal.  Abdominal: Soft. There is no tenderness.  Musculoskeletal: Normal range of motion.  Neurological: She is alert and oriented to person, place, and time. No cranial nerve deficit.  Skin: Skin is warm and dry.  Psychiatric: She has a normal mood and affect. Her behavior is normal.  Nursing note and vitals reviewed.    ED Treatments / Results  Labs (all labs ordered are listed, but only abnormal results are displayed) Labs Reviewed - No data to display  Radiology No results found.  Procedures Procedures (including critical care  time)  Medications Ordered in ED Medications - No data to display   Initial Impression / Assessment and Plan / ED Course  I have reviewed the triage vital signs and the nursing notes. 42 y.o. female with sinus congestion and headache stable for d/c without fever and does not appear toxic. Will treat for sinusitis and patient to f/u with PCP or return here for worsening symptoms   Final Clinical Impressions(s) / ED Diagnoses   Final diagnoses:  Acute maxillary sinusitis, recurrence not specified    ED Discharge Orders        Ordered    amoxicillin (AMOXIL) 500 MG capsule  3 times daily     05/06/18 1737       Damian Leavelleese, [place] M, NP 05/06/18 1747    Jacalyn LefevreHaviland, Julie, MD 05/06/18 [phone]

## 2018-05-06 NOTE — ED Triage Notes (Signed)
Patient here from home with complaints of sinus pressure. States that she called out of work today and job is requesting a note from a doctor confirming illness.

## 2018-05-06 NOTE — Discharge Instructions (Addendum)
You may continue to take the sudafed and other over the counter medications with the prescription we give you today. Follow up with your doctor next week. Return here for worsening symptoms

## 2018-07-31 ENCOUNTER — Inpatient Hospital Stay (HOSPITAL_COMMUNITY)
Admission: AD | Admit: 2018-07-31 | Discharge: 2018-07-31 | Disposition: A | Discharge status: Home / Self Care | Payer: Self-pay | Source: Ambulatory Visit | Attending: Obstetrics & Gynecology | Admitting: Obstetrics & Gynecology

## 2018-07-31 ENCOUNTER — Encounter (HOSPITAL_COMMUNITY): Payer: Self-pay | Admitting: *Deleted

## 2018-07-31 DIAGNOSIS — N939 Abnormal uterine and vaginal bleeding, unspecified: Secondary | ICD-10-CM

## 2018-07-31 DIAGNOSIS — Z7982 Long term (current) use of aspirin: Secondary | ICD-10-CM | POA: Insufficient documentation

## 2018-07-31 DIAGNOSIS — N898 Other specified noninflammatory disorders of vagina: Secondary | ICD-10-CM | POA: Insufficient documentation

## 2018-07-31 DIAGNOSIS — Z79899 Other long term (current) drug therapy: Secondary | ICD-10-CM | POA: Insufficient documentation

## 2018-07-31 DIAGNOSIS — F17210 Nicotine dependence, cigarettes, uncomplicated: Secondary | ICD-10-CM | POA: Insufficient documentation

## 2018-07-31 DIAGNOSIS — R102 Pelvic and perineal pain: Secondary | ICD-10-CM | POA: Insufficient documentation

## 2018-07-31 HISTORY — DX: Other specified health status: Z78.9

## 2018-07-31 LAB — POCT PREGNANCY, URINE: Preg Test, Ur: NEGATIVE

## 2018-07-31 LAB — URINALYSIS, ROUTINE W REFLEX MICROSCOPIC
BILIRUBIN URINE: NEGATIVE
GLUCOSE, UA: NEGATIVE mg/dL
Ketones, ur: NEGATIVE mg/dL
NITRITE: NEGATIVE
PH: 6 (ref 5.0–8.0)
Protein, ur: NEGATIVE mg/dL
RBC / HPF: >50 RBC/hpf — ABNORMAL HIGH (ref 0–5)
Specific Gravity, Urine: 1.021 (ref 1.005–1.030)

## 2018-07-31 LAB — WET PREP, GENITAL
Sperm: NONE SEEN
Trich, Wet Prep: NONE SEEN
Yeast Wet Prep HPF POC: NONE SEEN

## 2018-07-31 LAB — CBC WITH DIFFERENTIAL/PLATELET
Basophils Absolute: 0 10*3/uL (ref 0.0–0.1)
Basophils Relative: 0 %
EOS PCT: 3 %
Eosinophils Absolute: 0.3 10*3/uL (ref 0.0–0.7)
HEMATOCRIT: 36.4 % (ref 36.0–46.0)
Hemoglobin: 12 g/dL (ref 12.0–15.0)
LYMPHS ABS: 2.6 10*3/uL (ref 0.7–4.0)
Lymphocytes Relative: 25 %
MCH: 30.1 pg (ref 26.0–34.0)
MCHC: 33 g/dL (ref 30.0–36.0)
MCV: 91.2 fL (ref 78.0–100.0)
MONO ABS: 0.4 10*3/uL (ref 0.1–1.0)
MONOS PCT: 4 %
Neutro Abs: 6.9 10*3/uL (ref 1.7–7.7)
Neutrophils Relative %: 68 %
Platelets: 220 10*3/uL (ref 150–400)
RBC: 3.99 MIL/uL (ref 3.87–5.11)
RDW: 12.9 % (ref 11.5–15.5)
WBC: 10.3 10*3/uL (ref 4.0–10.5)

## 2018-07-31 MED ORDER — IBUPROFEN 800 MG PO TABS: 800.0000 mg | ORAL_TABLET | Freq: Three times a day (TID) | ORAL | 0 refills | Status: DC | PRN

## 2018-07-31 MED ORDER — KETOROLAC TROMETHAMINE 60 MG/2ML IM SOLN
60.0000 mg | Freq: Once | INTRAMUSCULAR | Status: AC
  Administered 2018-07-31: 60 mg via INTRAMUSCULAR
  Filled 2018-07-31: qty 2

## 2018-07-31 NOTE — Progress Notes (Signed)
Written and verbal d/c instructions given and understanding voiced. 

## 2018-07-31 NOTE — MAU Note (Addendum)
I have been on my period and today is 10th day. Have had BTL and having abd pain. Breasts sore. Feel like something sitting on my stomach. Using pads/day. Pain more LLQ and is sharp. Having normal BMS

## 2018-07-31 NOTE — MAU Provider Note (Signed)
History     CSN: [id number]  Arrival date and time: 07/31/18 [id number]   First Provider Initiated Contact with Patient 07/31/18 2057      Chief Complaint  Patient presents with  . menstrual complications   Vaginal Bleeding  The patient's primary symptoms include pelvic pain and vaginal bleeding. This is a new problem. Episode onset: 07/21/18. The problem occurs constantly. The problem has been unchanged. Pain severity now: 7/10. The problem affects the left side. She is not pregnant. Associated symptoms include nausea. Pertinent negatives include no chills, constipation, diarrhea, dysuria, fever, frequency, urgency or vomiting. The vaginal discharge was bloody. Vaginal bleeding amount: soaking 2-3 pads per day  She has been passing clots (about the size of a golf ball). Nothing aggravates the symptoms. Treatments tried: pamprin  The treatment provided moderate relief. She is sexually active. She uses tubal ligation for contraception. Her menstrual history has been regular (LNMP: 06/20/18, usually has regular cycles that last about 4-5 days. ).    OB History    Gravida  2   Para  2   Term  2   Preterm      AB      Living  2     SAB      TAB      Ectopic      Multiple      Live Births  2           Past Medical History:  Diagnosis Date  . Medical history non-contributory     Past Surgical History:  Procedure Laterality Date  . TUBAL LIGATION      History reviewed. No pertinent family history.  Social History   Tobacco Use  . Smoking status: Current Every Day Smoker    Packs/day: 0.50    Types: Cigarettes  . Smokeless tobacco: Never Used  Substance Use Topics  . Alcohol use: Yes    Comment: occ  . Drug use: Yes    Types: Marijuana    Allergies: No Known Allergies  Medications Prior to Admission  Medication Sig Dispense Refill Last Dose  . aspirin-acetaminophen-caffeine (EXCEDRIN MIGRAINE) 250-250-65 MG tablet Take 2 tablets by mouth every 6 (six)  hours as needed for headache.    at 1400  . amoxicillin (AMOXIL) 500 MG capsule Take 1 capsule (500 mg total) by mouth 3 (three) times daily. 30 capsule 0   . Chlorphen-Phenyleph-ASA (ALKA-SELTZER PLUS COLD PO) Take 2 tablets by mouth as needed (Cold symptoms).   12/07/2017 at Unknown time  . cyclobenzaprine (FLEXERIL) 5 MG tablet Take 1 tablet (5 mg total) by mouth at bedtime. 15 tablet 0     Review of Systems  Constitutional: Negative for chills and fever.  Gastrointestinal: Positive for nausea. Negative for constipation, diarrhea and vomiting.  Genitourinary: Positive for pelvic pain and vaginal bleeding. Negative for dysuria, frequency and urgency.   Physical Exam   Blood pressure 119/70, pulse 88, temperature 98.3 F (36.8 C), resp. rate 18, height 5\' 2"  (1.575 m), weight 64.9 kg, last menstrual period 07/21/2018, SpO2 100 %.  Physical Exam  Nursing note and vitals reviewed. Constitutional: She is oriented to person, place, and time. She appears well-developed and well-nourished. No distress.  HENT:  Head: Normocephalic.  Cardiovascular: Normal rate.  Respiratory: Effort normal.  GI: Soft. There is no tenderness. There is no rebound.  Neurological: She is alert and oriented to person, place, and time.  Skin: Skin is warm and dry.  Psychiatric: She has a normal  mood and affect.     Results for orders placed or performed during the hospital encounter of 07/31/18 (from the past 24 hour(s))  Urinalysis, Routine w reflex microscopic     Status: Abnormal   Collection Time: 07/31/18  8:02 PM  Result Value Ref Range   Color, Urine YELLOW YELLOW   APPearance HAZY (A) CLEAR   Specific Gravity, Urine 1.021 1.005 - 1.030   pH 6.0 5.0 - 8.0   Glucose, UA NEGATIVE NEGATIVE mg/dL   Hgb urine dipstick LARGE (A) NEGATIVE   Bilirubin Urine NEGATIVE NEGATIVE   Ketones, ur NEGATIVE NEGATIVE mg/dL   Protein, ur NEGATIVE NEGATIVE mg/dL   Nitrite NEGATIVE NEGATIVE   Leukocytes, UA TRACE (A)  NEGATIVE   RBC / HPF >50 (H) 0 - 5 RBC/hpf   WBC, UA 6-10 0 - 5 WBC/hpf   Bacteria, UA RARE (A) NONE SEEN   Squamous Epithelial / LPF 6-10 0 - 5   Mucus PRESENT   Pregnancy, urine POC     Status: None   Collection Time: 07/31/18  8:08 PM  Result Value Ref Range   Preg Test, Ur NEGATIVE NEGATIVE  Wet prep, genital     Status: Abnormal   Collection Time: 07/31/18  9:08 PM  Result Value Ref Range   Yeast Wet Prep HPF POC NONE SEEN NONE SEEN   Trich, Wet Prep NONE SEEN NONE SEEN   Clue Cells Wet Prep HPF POC PRESENT (A) NONE SEEN   WBC, Wet Prep HPF POC MANY (A) NONE SEEN   Sperm NONE SEEN   CBC with Differential/Platelet     Status: None   Collection Time: 07/31/18  9:23 PM  Result Value Ref Range   WBC 10.3 4.0 - 10.5 K/uL   RBC 3.99 3.87 - 5.11 MIL/uL   Hemoglobin 12.0 12.0 - 15.0 g/dL   HCT 09.8 11.9 - 14.7 %   MCV 91.2 78.0 - 100.0 fL   MCH 30.1 26.0 - 34.0 pg   MCHC 33.0 30.0 - 36.0 g/dL   RDW 82.9 56.2 - 13.0 %   Platelets 220 150 - 400 K/uL   Neutrophils Relative % 68 %   Neutro Abs 6.9 1.7 - 7.7 K/uL   Lymphocytes Relative 25 %   Lymphs Abs 2.6 0.7 - 4.0 K/uL   Monocytes Relative 4 %   Monocytes Absolute 0.4 0.1 - 1.0 K/uL   Eosinophils Relative 3 %   Eosinophils Absolute 0.3 0.0 - 0.7 K/uL   Basophils Relative 0 %   Basophils Absolute 0.0 0.0 - 0.1 K/uL    MAU Course  Procedures  MDM Patient has had toradol for pain. She reports that her pain is better. DW patient options including watching and waiting to see if the bleeding resolves or medical management. Patient would like to wait and see as this has never happened before. Reassured that it will likely be self limiting, and that we can see her for FU in the clinic.   Assessment and Plan   1. Abnormal uterine bleeding    DC home Comfort measures reviewed  Bleeding precautions RX: ibuprofen 800mg  PRN #[id number]  Return to MAU as needed   Follow-up Information    [facility] up.    Specialty:  Obstetrics and Gynecology Contact information: [address] [place] [address] [phone]           Thressa Sheller 07/31/2018, 9:01 PM

## 2018-07-31 NOTE — Discharge Instructions (Signed)

## 2018-08-04 LAB — GC/CHLAMYDIA PROBE AMP (~~LOC~~) NOT AT ARMC
Chlamydia: POSITIVE — AB
NEISSERIA GONORRHEA: NEGATIVE

## 2018-08-10 ENCOUNTER — Ambulatory Visit: Payer: Self-pay | Admitting: Advanced Practice Midwife

## 2018-08-27 ENCOUNTER — Other Ambulatory Visit: Payer: Self-pay | Admitting: Obstetrics and Gynecology

## 2018-08-27 MED ORDER — AZITHROMYCIN 250 MG PO TABS: 1000.0000 mg | ORAL_TABLET | Freq: Once | ORAL | 0 refills | Status: AC

## 2018-08-27 NOTE — Progress Notes (Signed)
Patient was positive for chlamydia on 8/30, we attempted to call her to give results and sent treatment; she was unreachable. The patient received a letter in the mail and presents to MAU requesting an RX.   Rx for azithromycin sent to pharmacy, partner needs treatment. No intercourse X7 days once both are treated. Condoms always advised  Venia Carbon I, NP 08/27/2018 10:40 AM

## 2018-09-06 ENCOUNTER — Encounter (HOSPITAL_COMMUNITY): Payer: Self-pay

## 2018-09-06 ENCOUNTER — Ambulatory Visit (HOSPITAL_COMMUNITY)
Admission: EM | Admit: 2018-09-06 | Discharge: 2018-09-06 | Disposition: A | Discharge status: Home / Self Care | Payer: Self-pay | Attending: Internal Medicine | Admitting: Internal Medicine

## 2018-09-06 DIAGNOSIS — B070 Plantar wart: Secondary | ICD-10-CM

## 2018-09-06 NOTE — ED Provider Notes (Signed)
[facility]    CSN: [id number] Arrival date & time: 09/06/18  1729     History   Chief Complaint Chief Complaint  Patient presents with  . Foot Issue    Right    HPI [name] is a 42 y.o. female.   [name] presents with complaints of pain to bottom of her right foot and to her 4th toe. Calluses present, has peeled off the callus to one area. No drainage. No redness. No numbness or tingling. Pain with weight bearing. Denies any previous similar. Has not taken any medications or tried any treatments for symptoms.    ROS per HPI.      Past Medical History:  Diagnosis Date  . Medical history non-contributory     There are no active problems to display for this patient.   Past Surgical History:  Procedure Laterality Date  . TUBAL LIGATION      OB History    Gravida  2   Para  2   Term  2   Preterm      AB      Living  2     SAB      TAB      Ectopic      Multiple      Live Births  2            Home Medications    Prior to Admission medications   Medication Sig Start Date End Date Taking? Authorizing Provider  aspirin-acetaminophen-caffeine (EXCEDRIN MIGRAINE) [phone] MG tablet Take 2 tablets by mouth every 6 (six) hours as needed for headache.    [provider]  ibuprofen (ADVIL,MOTRIN) 800 MG tablet Take 1 tablet (800 mg total) by mouth every 8 (eight) hours as needed. 07/31/18   Armando Reichert, CNM    Family History History reviewed. No pertinent family history.  Social History Social History   Tobacco Use  . Smoking status: Current Every Day Smoker    Packs/day: 0.50    Types: Cigarettes  . Smokeless tobacco: Never Used  Substance Use Topics  . Alcohol use: Yes    Comment: occ  . Drug use: Yes    Types: Marijuana     Allergies   Patient has no known allergies.   Review of Systems Review of Systems   Physical Exam Triage Vital Signs ED Triage Vitals  Enc Vitals Group   BP 09/06/18 1747 (!) 84/59     Pulse Rate 09/06/18 1747 82     Resp 09/06/18 1747 20     Temp 09/06/18 1747 98.2 F (36.8 C)     Temp Source 09/06/18 1747 Oral     SpO2 09/06/18 1747 99 %     Weight --      Height --      Head Circumference --      Peak Flow --      Pain Score 09/06/18 1748 7     Pain Loc --      Pain Edu? --      Excl. in GC? --    No data found.  Updated Vital Signs BP (!) 84/59 (BP Location: Right Arm)   Pulse 82   Temp 98.2 F (36.8 C) (Oral)   Resp 20   LMP 07/22/2018   SpO2 99%    Physical Exam  Constitutional: She is oriented to person, place, and time. She appears well-developed and well-nourished. No distress.  Cardiovascular: Normal rate, regular rhythm and normal  heart sounds.  Pulmonary/Chest: Effort normal and breath sounds normal.  Musculoskeletal:       Right ankle: Normal.       Feet:  Two plantar's warts to sole of foot and one to tip of 4th toe; no redness, swelling; strong pedal pulse; cap refill < 2 seconds ; ambulatory without difficulty   Neurological: She is alert and oriented to person, place, and time.  Skin: Skin is warm and dry.     UC Treatments / Results  Labs (all labs ordered are listed, but only abnormal results are displayed) Labs Reviewed - No data to display  EKG None  Radiology No results found.  Procedures Procedures (including critical care time)  Medications Ordered in UC Medications - No data to display  Initial Impression / Assessment and Plan / UC Course  I have reviewed the triage vital signs and the nursing notes.  Pertinent labs & imaging results that were available during my care of the patient were reviewed by me and considered in my medical decision making (see chart for details).     Discussed treatment options, patient states will try OTC treatments, discussed that can take some time for resolution. To follow up with podiatry as needed if persistent. Patient verbalized understanding  and agreeable to plan.  Ambulatory out of clinic without difficulty.   Final Clinical Impressions(s) / UC Diagnoses   Final diagnoses:  Plantar wart     Discharge Instructions     You may use over the counter treatment for your plantar warts.  If no improvement please follow up with podiatry for definitive treatment.    ED Prescriptions    None     Controlled Substance Prescriptions [place] Controlled Substance Registry consulted? Not Applicable   Georgetta Haber, NP 09/06/18 1843

## 2018-09-06 NOTE — ED Triage Notes (Signed)
Pt presents with painful calluses on bottom of right foot.

## 2018-09-06 NOTE — Discharge Instructions (Signed)
You may use over the counter treatment for your plantar warts.  If no improvement please follow up with podiatry for definitive treatment.

## 2018-09-15 ENCOUNTER — Other Ambulatory Visit: Payer: Self-pay

## 2018-09-15 ENCOUNTER — Encounter (HOSPITAL_COMMUNITY): Payer: Self-pay

## 2018-09-15 ENCOUNTER — Emergency Department (HOSPITAL_COMMUNITY)
Admission: EM | Admit: 2018-09-15 | Discharge: 2018-09-15 | Disposition: A | Discharge status: Home / Self Care | Payer: Self-pay | Attending: Emergency Medicine | Admitting: Emergency Medicine

## 2018-09-15 DIAGNOSIS — Y999 Unspecified external cause status: Secondary | ICD-10-CM | POA: Insufficient documentation

## 2018-09-15 DIAGNOSIS — Y9389 Activity, other specified: Secondary | ICD-10-CM | POA: Insufficient documentation

## 2018-09-15 DIAGNOSIS — T192XXA Foreign body in vulva and vagina, initial encounter: Secondary | ICD-10-CM | POA: Insufficient documentation

## 2018-09-15 DIAGNOSIS — Z189 Retained foreign body fragments, unspecified material: Secondary | ICD-10-CM | POA: Insufficient documentation

## 2018-09-15 DIAGNOSIS — F17210 Nicotine dependence, cigarettes, uncomplicated: Secondary | ICD-10-CM | POA: Insufficient documentation

## 2018-09-15 DIAGNOSIS — X58XXXA Exposure to other specified factors, initial encounter: Secondary | ICD-10-CM | POA: Insufficient documentation

## 2018-09-15 DIAGNOSIS — Y929 Unspecified place or not applicable: Secondary | ICD-10-CM | POA: Insufficient documentation

## 2018-09-15 NOTE — ED Notes (Signed)
ED Provider at bedside. 
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
## 2018-09-15 NOTE — ED Triage Notes (Signed)
Pt states that she has 2 makeup sponges stuck in her vagina. Pt was using them during intercourse to stop her vaginal bleeding from her period. Objects have been in vagina since 630 this morning.

## 2018-09-15 NOTE — ED Provider Notes (Signed)
[facility] EMERGENCY DEPARTMENT Provider Note  CSN: [id number] Arrival date & time: 09/15/18  7829  History   Chief Complaint Chief Complaint  Patient presents with  . Foreign Body in Vagina    HPI [name] is a 42 y.o. female with no signifcant medical history who presented to the ED for vaginal foreign body. Patient states she placed 2 make up sponges in her vagina before intercourse with her partner to prevent bleeding since she is on her menstrual cycle. Patient unable to retrieve objects after intercourse. Denies fever, chills, abdominal pain, vaginal pain and vaginal discharge.    Past Medical History:  Diagnosis Date  . Medical history non-contributory     There are no active problems to display for this patient.   Past Surgical History:  Procedure Laterality Date  . TUBAL LIGATION       OB History    Gravida  2   Para  2   Term  2   Preterm      AB      Living  2     SAB      TAB      Ectopic      Multiple      Live Births  2            Home Medications    Prior to Admission medications   Medication Sig Start Date End Date Taking? Authorizing Provider  aspirin-acetaminophen-caffeine (EXCEDRIN MIGRAINE) [id number] MG tablet Take 2 tablets by mouth every 6 (six) hours as needed for headache.    [provider]  ibuprofen (ADVIL,MOTRIN) 800 MG tablet Take 1 tablet (800 mg total) by mouth every 8 (eight) hours as needed. 07/31/18   Armando Reichert, CNM    Family History No family history on file.  Social History Social History   Tobacco Use  . Smoking status: Current Every Day Smoker    Packs/day: 0.50    Types: Cigarettes  . Smokeless tobacco: Never Used  Substance Use Topics  . Alcohol use: Yes    Comment: occ  . Drug use: Yes    Types: Marijuana     Allergies   Patient has no known allergies.   Review of Systems Review of Systems  Constitutional: Negative for chills and fever.    Gastrointestinal: Negative.   Genitourinary: Positive for vaginal bleeding (on menstrual cycle). Negative for menstrual problem, pelvic pain, vaginal discharge and vaginal pain.  Musculoskeletal: Negative.   Skin: Negative.      Physical Exam Updated Vital Signs BP 119/81 (BP Location: Left Arm)   Pulse 92   Temp 98.6 F (37 C) (Oral)   Resp 18   Ht 5\' 2"  (1.575 m)   Wt 63 kg   LMP 09/15/2018   SpO2 96%   BMI 25.42 kg/m   Physical Exam  Constitutional: Vital signs are normal. She appears well-developed and well-nourished. She is cooperative. No distress.  Abdominal: Soft. Normal appearance and bowel sounds are normal. There is no tenderness.  Genitourinary: Cervix exhibits no discharge and no friability. There is bleeding in the vagina. No erythema or tenderness in the vagina.  There is a foreign body in the vagina. No signs of injury around the vagina. No vaginal discharge found.  Genitourinary Comments: RN chaperone during procedure. 2 circular make-up sponges visualized and removed from vagina. Cervix and vaginal walls normal with scant blood seen.  Neurological: She is alert.  Skin: Skin is warm. Capillary refill  takes less than 2 seconds.  Nursing note and vitals reviewed.    ED Treatments / Results  Labs (all labs ordered are listed, but only abnormal results are displayed) Labs Reviewed - No data to display  EKG None  Radiology No results found.  Procedures .Foreign Body Removal Date/Time: 09/15/2018 12:28 PM Performed by: Windy Carina, PA-C Authorized by: Windy Carina, PA-C  Consent: Verbal consent obtained. Risks and benefits: risks, benefits and alternatives were discussed Consent given by: patient Patient understanding: patient states understanding of the procedure being performed Body area: vagina Patient cooperative: yes Removal mechanism: alligator forceps Complexity: simple 2 objects recovered. Objects recovered: circular  makeup sponges Post-procedure assessment: foreign body removed Patient tolerance: Patient tolerated the procedure well with no immediate complications   (including critical care time)  Medications Ordered in ED Medications - No data to display   Initial Impression / Assessment and Plan / ED Course  Triage vital signs and the nursing notes have been reviewed.  Pertinent labs & imaging results that were available during care of the patient were reviewed and considered in medical decision making (see chart for details).   Patient presented to the ED with vaginal foreign bodies. These objects have been in her vagina for approx. 3 hours before coming to the ED. She has no signs of infection presently that require further evaluation or antibiotics at discharge. Foreign bodies were able to be removed without complication.   Final Clinical Impressions(s) / ED Diagnoses  1. Vaginal Foreign Body. 2 objects removed without complication. Education provided on vaginal care, s/s of infection and appropriate follow-up.  Dispo: Home. After thorough clinical evaluation, this patient is determined to be medically stable and can be safely discharged with the previously mentioned treatment and/or outpatient follow-up/referral(s). At this time, there are no other apparent medical conditions that require further screening, evaluation or treatment.   Final diagnoses:  Foreign body in vagina, initial encounter    ED Discharge Orders    None        Reva Bores 09/15/18 1241    Cathren Laine, MD 09/15/18 1308

## 2018-12-26 IMAGING — CT CT ABD-PELV W/ CM
2 of 5 series · 16 of 46 positions shown, 18 images · IV contrast (ISOVUE)
Comparison: None.

CLINICAL DATA: Lower abdominal pain for 1 day.

EXAM:
CT ABDOMEN AND PELVIS WITH CONTRAST
TECHNIQUE: Multidetector CT imaging of the abdomen and pelvis was performed
using the standard protocol following bolus administration of
intravenous contrast.
CONTRAST:  100mL H27HNM-JXX IOPAMIDOL (H27HNM-JXX) INJECTION 61%

[Series 2: axial st · axial · 0.64mm/px · z∈[-448,-84]mm · 13 of 87 slices shown, 15 images]
[im 7/87  soft-tissue]
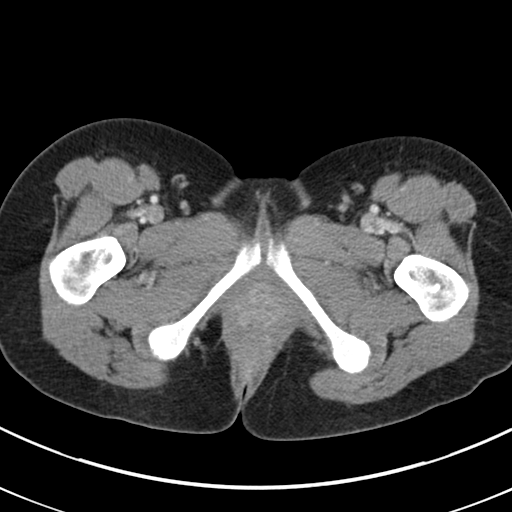
[im 7/87  bone]
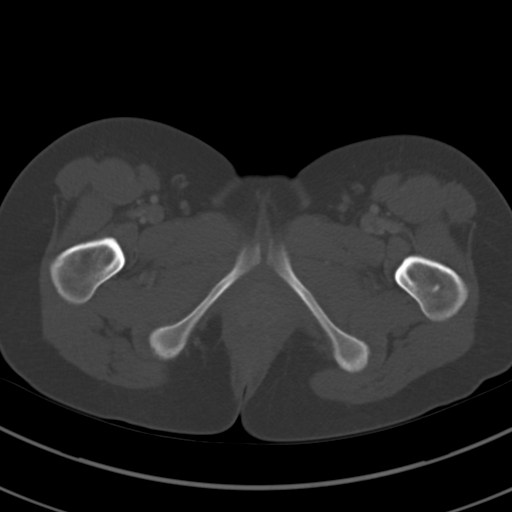
[im 13/87  soft-tissue]
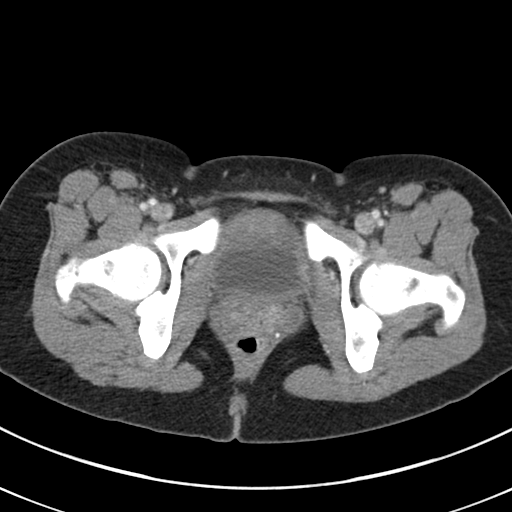
[im 19/87  soft-tissue]
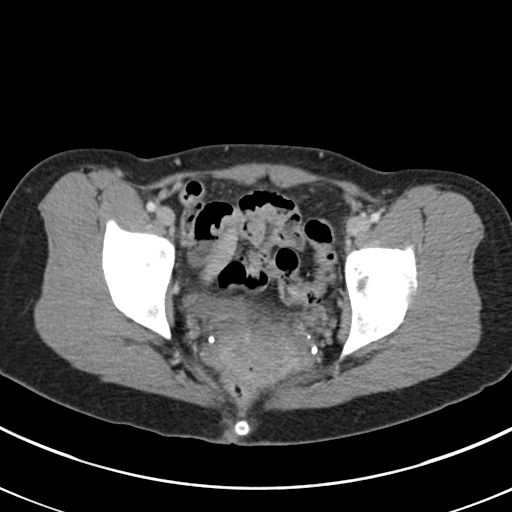
[im 25/87  soft-tissue]
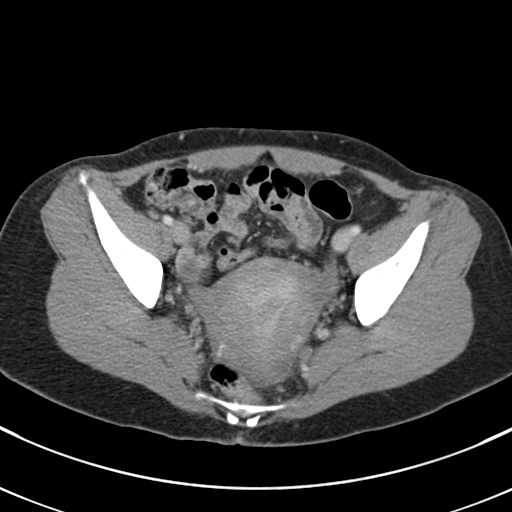
[im 31/87  soft-tissue]
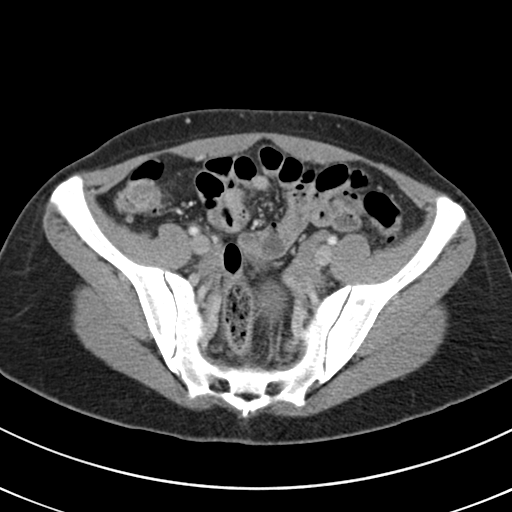
[im 37/87  soft-tissue]
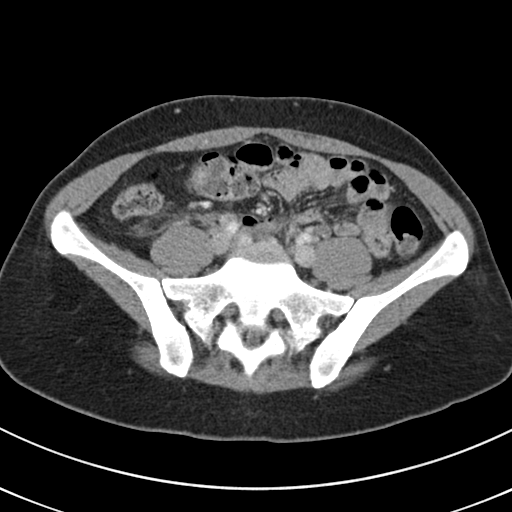
[im 44/87  soft-tissue]
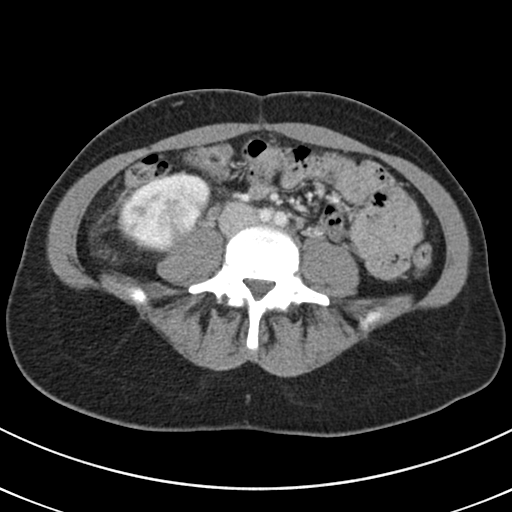
[im 50/87  soft-tissue]
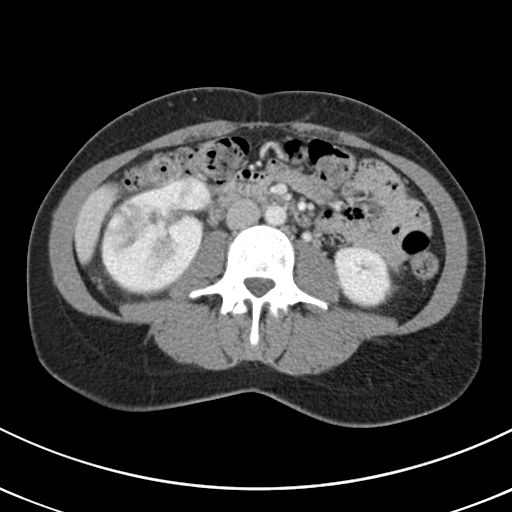
[im 56/87  soft-tissue]
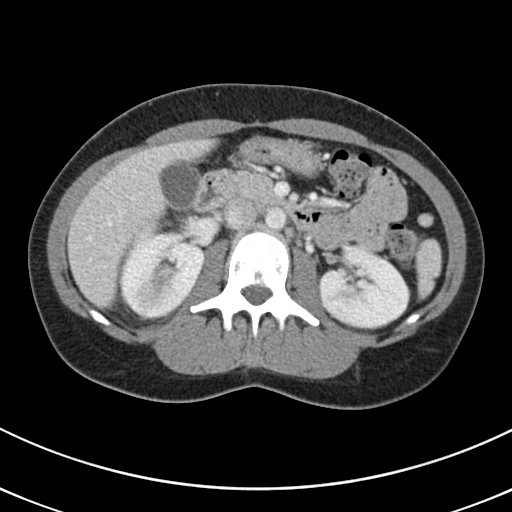
[im 56/87  bone]
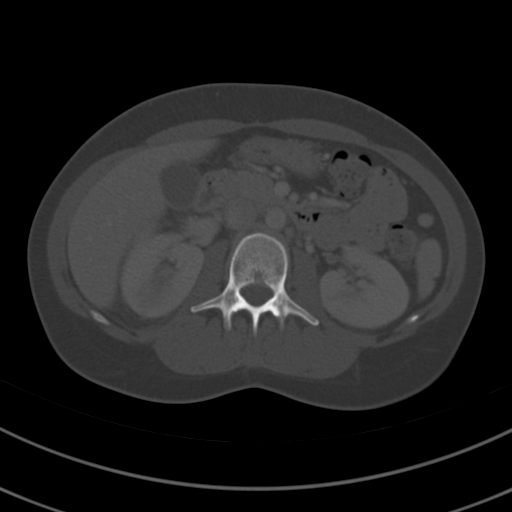
[im 62/87  soft-tissue]
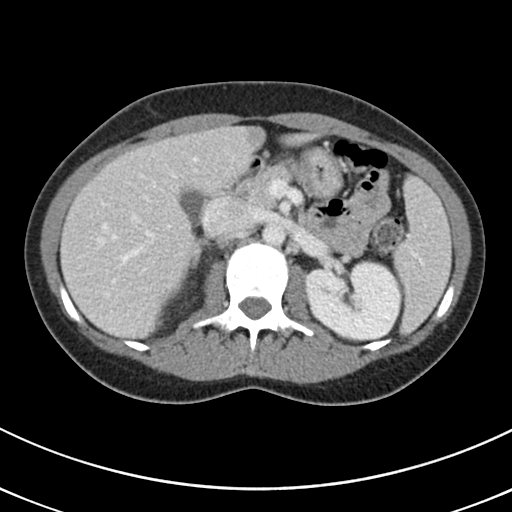
[im 68/87  soft-tissue]
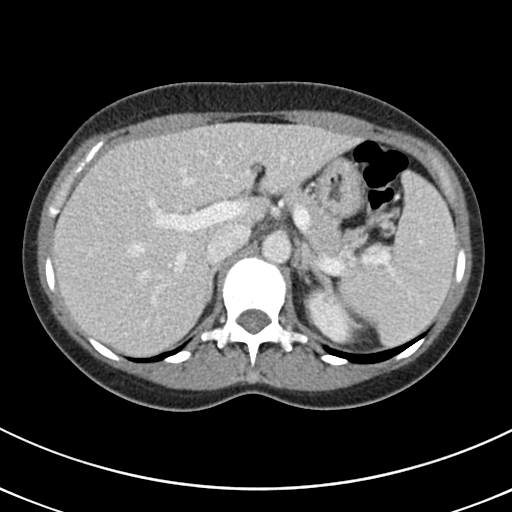
[im 74/87  soft-tissue]
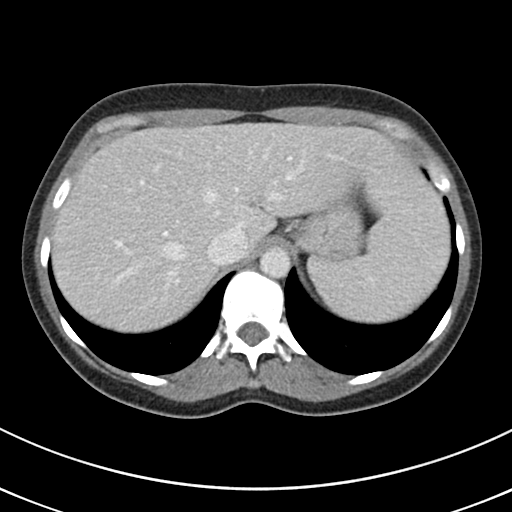
[im 80/87  soft-tissue]
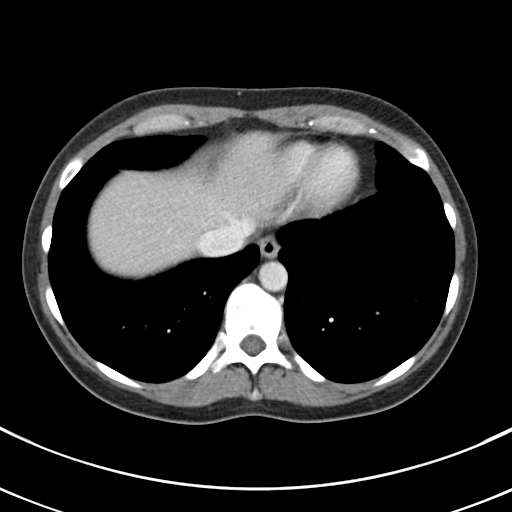

[Series 4: coronal st · coronal · 0.71mm/px · 3 of 97 slices shown]
[im 33/97  soft-tissue]
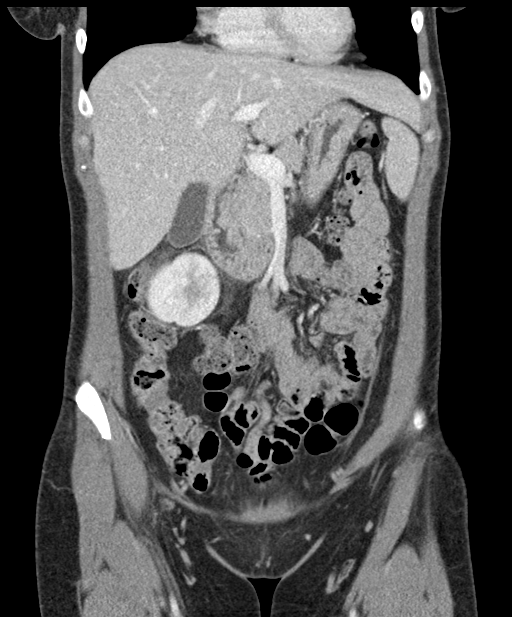
[im 43/97  soft-tissue]
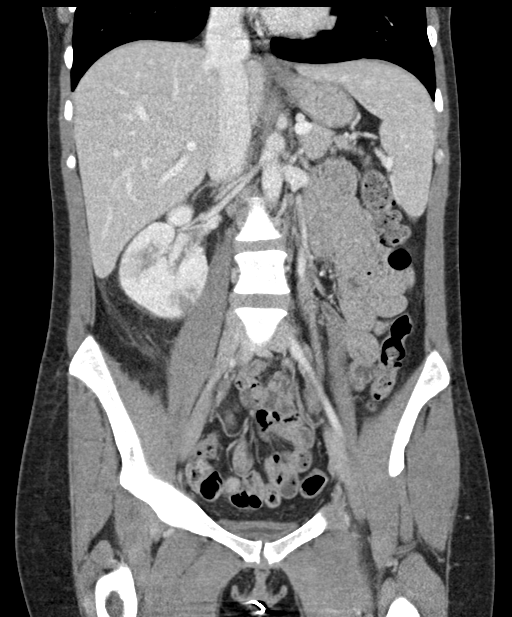
[im 54/97  soft-tissue]
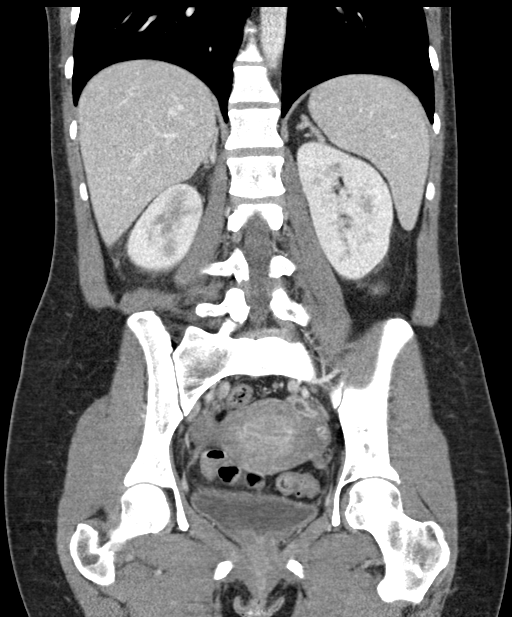

[16 of 46 positions shown; findings below may reference images not displayed]

FINDINGS: Lower Chest: No acute findings.

Hepatobiliary: No hepatic masses identified. Gallbladder is
unremarkable.

Pancreas:  No mass or inflammatory changes.

Spleen: Within normal limits in size and appearance.

Adrenals/Urinary Tract: Right renal swelling and mild perinephric
stranding is seen with multiple patchy areas of decreased contrast
enhancement throughout the right kidney. There are also several
subtle areas of decreased parenchymal enhancement in the upper and
lower poles of the left kidney. These findings are consistent with
pyelonephritis. No evidence of renal abscess. No evidence of renal
mass or hydronephrosis. Unremarkable unopacified urinary bladder.

Stomach/Bowel: No evidence of obstruction, inflammatory process or
abnormal fluid collections.

Vascular/Lymphatic: No pathologically enlarged lymph nodes. No
abdominal aortic aneurysm.

Reproductive:  No mass or other significant abnormality.

Other:  None.

Musculoskeletal:  No suspicious bone lesions identified.
IMPRESSION: Bilateral pyelonephritis, right side greater than left.

No evidence of renal abscess or hydronephrosis.

## 2019-03-24 ENCOUNTER — Encounter (HOSPITAL_COMMUNITY): Payer: Self-pay | Admitting: Emergency Medicine

## 2019-03-24 ENCOUNTER — Ambulatory Visit (HOSPITAL_COMMUNITY)
Admission: EM | Admit: 2019-03-24 | Discharge: 2019-03-24 | Disposition: A | Discharge status: Home / Self Care | Payer: Self-pay | Attending: Family Medicine | Admitting: Family Medicine

## 2019-03-24 ENCOUNTER — Other Ambulatory Visit: Payer: Self-pay

## 2019-03-24 DIAGNOSIS — Z113 Encounter for screening for infections with a predominantly sexual mode of transmission: Secondary | ICD-10-CM

## 2019-03-24 DIAGNOSIS — Z202 Contact with and (suspected) exposure to infections with a predominantly sexual mode of transmission: Secondary | ICD-10-CM | POA: Insufficient documentation

## 2019-03-24 MED ORDER — CEFTRIAXONE SODIUM 250 MG IJ SOLR
250.0000 mg | Freq: Once | INTRAMUSCULAR | Status: AC
  Administered 2019-03-24: 250 mg via INTRAMUSCULAR

## 2019-03-24 MED ORDER — CEFTRIAXONE SODIUM 250 MG IJ SOLR
INTRAMUSCULAR | Status: AC
  Filled 2019-03-24: qty 250

## 2019-03-24 MED ORDER — AZITHROMYCIN 250 MG PO TABS
ORAL_TABLET | ORAL | Status: AC
  Filled 2019-03-24: qty 4

## 2019-03-24 MED ORDER — AZITHROMYCIN 250 MG PO TABS
1000.0000 mg | ORAL_TABLET | Freq: Once | ORAL | Status: AC
  Administered 2019-03-24: 19:00:00 1000 mg via ORAL

## 2019-03-24 NOTE — ED Triage Notes (Signed)
Pt here for STD screening; pt denies sx  

## 2019-03-24 NOTE — Discharge Instructions (Signed)

## 2019-03-24 NOTE — ED Notes (Signed)
Blood drawn by Penni Bombard, RT

## 2019-03-24 NOTE — ED Notes (Signed)
Patient verbalizes understanding of discharge instructions. Opportunity for questioning and answers were provided. Patient discharged from [facility] by RN.  

## 2019-03-25 ENCOUNTER — Telehealth (HOSPITAL_COMMUNITY): Payer: Self-pay | Admitting: Emergency Medicine

## 2019-03-25 LAB — HIV ANTIBODY (ROUTINE TESTING W REFLEX): HIV Screen 4th Generation wRfx: NONREACTIVE

## 2019-03-25 LAB — CERVICOVAGINAL ANCILLARY ONLY
Bacterial vaginitis: POSITIVE — AB
Candida vaginitis: NEGATIVE
Chlamydia: NEGATIVE
Neisseria Gonorrhea: POSITIVE — AB
Trichomonas: POSITIVE — AB

## 2019-03-25 LAB — RPR: RPR Ser Ql: NONREACTIVE

## 2019-03-25 MED ORDER — METRONIDAZOLE 500 MG PO TABS: 500.0000 mg | ORAL_TABLET | Freq: Two times a day (BID) | ORAL | 0 refills | Status: DC

## 2019-03-25 NOTE — Telephone Encounter (Signed)
Test for gonorrhea was positive. This was treated at the urgent care visit with IM rocephin 250mg  and po zithromax 1g. Pt needs education to refrain from sexual intercourse for 7 days after treatment to give the medicine time to work. Sexual partners need to be notified and tested/treated. Condoms may reduce risk of reinfection. Recheck or followup with PCP for further evaluation if symptoms are not improving. [facility] notified.   Trichomonas is positive. Rx  for Flagyl 2 grams, once was sent to the pharmacy of record. Pt needs education to refrain from sexual intercourse for 7 days to give the medicine time to work. Sexual partners need to be notified and tested/treated. Condoms may reduce risk of reinfection. Recheck for further evaluation if symptoms are not improving.   Bacterial vaginosis is positive. This was not treated at the urgent care visit.  Flagyl 500 mg BID x 7 days #14 no refills sent to patients pharmacy of choice.    Attempted to reach patient. No answer at this time. Voicemail left.
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
## 2019-03-29 ENCOUNTER — Telehealth (HOSPITAL_COMMUNITY): Payer: Self-pay | Admitting: Emergency Medicine

## 2019-03-29 ENCOUNTER — Encounter (HOSPITAL_COMMUNITY): Payer: Self-pay

## 2019-03-29 NOTE — Telephone Encounter (Signed)
Attempted to reach patient x2. No answer at this time. Voicemail left.    

## 2019-04-01 ENCOUNTER — Telehealth (HOSPITAL_COMMUNITY): Payer: Self-pay | Admitting: Emergency Medicine

## 2019-04-01 MED ORDER — METRONIDAZOLE 500 MG PO TABS: 500.0000 mg | ORAL_TABLET | Freq: Two times a day (BID) | ORAL | 0 refills | Status: AC

## 2019-04-01 NOTE — Telephone Encounter (Signed)
Patient called and left voicemail with new number, attempted to reach patient, no answer, left voicemail. Will try again one more time before sending letter.

## 2019-04-01 NOTE — Telephone Encounter (Signed)
Patient contacted and made aware of all results, all questions answered.   

## 2019-04-01 NOTE — Telephone Encounter (Signed)
Pharmacy change

## 2019-07-12 ENCOUNTER — Other Ambulatory Visit: Payer: Self-pay

## 2019-07-12 ENCOUNTER — Emergency Department (HOSPITAL_COMMUNITY): Payer: Self-pay

## 2019-07-12 ENCOUNTER — Encounter (HOSPITAL_COMMUNITY): Payer: Self-pay | Admitting: Emergency Medicine

## 2019-07-12 ENCOUNTER — Emergency Department (HOSPITAL_COMMUNITY)
Admission: EM | Admit: 2019-07-12 | Discharge: 2019-07-12 | Disposition: A | Discharge status: Home / Self Care | Payer: Self-pay | Attending: Emergency Medicine | Admitting: Emergency Medicine

## 2019-07-12 DIAGNOSIS — S0990XA Unspecified injury of head, initial encounter: Secondary | ICD-10-CM | POA: Insufficient documentation

## 2019-07-12 DIAGNOSIS — Y93E1 Activity, personal bathing and showering: Secondary | ICD-10-CM | POA: Insufficient documentation

## 2019-07-12 DIAGNOSIS — Z113 Encounter for screening for infections with a predominantly sexual mode of transmission: Secondary | ICD-10-CM | POA: Insufficient documentation

## 2019-07-12 DIAGNOSIS — Y999 Unspecified external cause status: Secondary | ICD-10-CM | POA: Insufficient documentation

## 2019-07-12 DIAGNOSIS — Y9259 Other trade areas as the place of occurrence of the external cause: Secondary | ICD-10-CM | POA: Insufficient documentation

## 2019-07-12 DIAGNOSIS — F17210 Nicotine dependence, cigarettes, uncomplicated: Secondary | ICD-10-CM | POA: Insufficient documentation

## 2019-07-12 LAB — POC URINE PREG, ED: Preg Test, Ur: NEGATIVE

## 2019-07-12 MED ORDER — LIDOCAINE HCL (PF) 1 % IJ SOLN
1.0000 mL | Freq: Once | INTRAMUSCULAR | Status: AC
  Administered 2019-07-12: 1 mL
  Filled 2019-07-12: qty 30

## 2019-07-12 MED ORDER — CEFTRIAXONE SODIUM 250 MG IJ SOLR
250.0000 mg | Freq: Once | INTRAMUSCULAR | Status: AC
  Administered 2019-07-12: 250 mg via INTRAMUSCULAR
  Filled 2019-07-12: qty 250

## 2019-07-12 MED ORDER — AZITHROMYCIN 250 MG PO TABS
1000.0000 mg | ORAL_TABLET | Freq: Once | ORAL | Status: AC
  Administered 2019-07-12: 1000 mg via ORAL
  Filled 2019-07-12: qty 4

## 2019-07-12 MED ORDER — LORAZEPAM 1 MG PO TABS
1.0000 mg | ORAL_TABLET | Freq: Once | ORAL | Status: AC
  Administered 2019-07-12: 1 mg via ORAL
  Filled 2019-07-12: qty 1

## 2019-07-12 MED ORDER — ACETAMINOPHEN 500 MG PO TABS
1000.0000 mg | ORAL_TABLET | Freq: Once | ORAL | Status: AC
  Administered 2019-07-12: 1000 mg via ORAL
  Filled 2019-07-12: qty 2
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
## 2019-07-12 NOTE — Discharge Instructions (Addendum)
You may alternate Tylenol 1000 mg every 6 hours as needed for pain and Ibuprofen 800 mg every 8 hours as needed for pain.  Please take Ibuprofen with food. ° °Steps to find a Primary Care Provider (PCP): ° °Call [phone] or [phone] to access "[facility] Find a Doctor Service." ° °2.  You may also go on the [facility] website at [email or web address] ° °3.  [facility] also frequently accepts new patients. ° °[facility]  °[address] °[place] [address] °[phone] ° °4.  There are also multiple [facility], [facility] and [facility] practices throughout the [place] that are frequently accepting new patients. You may find a clinic that is close to your home and contact them. ° °[facility] °[email or web address] °[phone] ° °[facility] °[email or web address] ° °[facility] °[email or web address] °[phone] ° °[facility] °[email or web address] °[phone] ° °5.  Local Health Departments also can provide primary care services. ° °[facility]  °[address] °[place] [address] °[phone] ° °[facility] °[address] °[place] [address] [phone] ° °[facility] °[address]  °[place] [address] °[phone] ° ° °
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
## 2019-07-13 LAB — HIV ANTIBODY (ROUTINE TESTING W REFLEX): HIV Screen 4th Generation wRfx: NONREACTIVE

## 2019-07-13 LAB — RPR: RPR Ser Ql: NONREACTIVE

## 2019-08-04 ENCOUNTER — Ambulatory Visit (HOSPITAL_COMMUNITY)
Admission: EM | Admit: 2019-08-04 | Discharge: 2019-08-04 | Disposition: A | Discharge status: Home / Self Care | Payer: Self-pay | Attending: Family Medicine | Admitting: Family Medicine

## 2019-08-04 ENCOUNTER — Other Ambulatory Visit: Payer: Self-pay

## 2019-08-04 ENCOUNTER — Encounter (HOSPITAL_COMMUNITY): Payer: Self-pay | Admitting: Family Medicine

## 2019-08-04 DIAGNOSIS — L0291 Cutaneous abscess, unspecified: Secondary | ICD-10-CM

## 2019-08-04 MED ORDER — CEPHALEXIN 500 MG PO CAPS: 500.0000 mg | ORAL_CAPSULE | Freq: Four times a day (QID) | ORAL | 0 refills | Status: DC

## 2019-08-04 NOTE — ED Triage Notes (Signed)
Pt states she has abscess and on her vagina. X 2 days.

## 2019-08-04 NOTE — Discharge Instructions (Addendum)
Treating you for infection with keflex 4 x day for 7 days.  Take as prescribed Ibuprofen for pain and warm compresses.  Follow up as needed for continued or worsening symptoms

## 2019-08-05 DIAGNOSIS — L0291 Cutaneous abscess, unspecified: Secondary | ICD-10-CM

## 2019-08-05 NOTE — ED Provider Notes (Signed)
[place]    CSN: [id number] Arrival date & time: 08/04/19  1247      History   Chief Complaint Chief Complaint  Patient presents with  . Abscess    HPI [name] is a 43 y.o. female.   Patient is a 43 year old female with no significant past medical history.  She presents today with abscess to right labia.  This is been present and worsening over the last 2 days.  The area is very swollen and painful.  Worse with sitting and walking.  She has been doing warm compresses and attempted to open the area up herself with minimal drainage.  Denies any associated fever, chills, bites or night sweats.  Patient does shave.  ROS per HPI      Past Medical History:  Diagnosis Date  . Medical history non-contributory     There are no active problems to display for this patient.   Past Surgical History:  Procedure Laterality Date  . TUBAL LIGATION      OB History    Gravida  2   Para  2   Term  2   Preterm      AB      Living  2     SAB      TAB      Ectopic      Multiple      Live Births  2            Home Medications    Prior to Admission medications   Medication Sig Start Date End Date Taking? Authorizing Provider  aspirin-acetaminophen-caffeine (EXCEDRIN MIGRAINE) [phone] MG tablet Take 2 tablets by mouth every 6 (six) hours as needed for headache.    [provider]  cephALEXin (KEFLEX) 500 MG capsule Take 1 capsule (500 mg total) by mouth 4 (four) times daily. 08/04/19   Loura Halt A, NP  ibuprofen (ADVIL,MOTRIN) 800 MG tablet Take 1 tablet (800 mg total) by mouth every 8 (eight) hours as needed. 07/31/18   Tresea Mall, CNM    Family History No family history on file.  Social History Social History   Tobacco Use  . Smoking status: Current Every Day Smoker    Packs/day: 0.50    Types: Cigarettes  . Smokeless tobacco: Never Used  Substance Use Topics  . Alcohol use: Yes    Comment: occ  . Drug  use: Yes    Types: Marijuana     Allergies   Patient has no known allergies.   Review of Systems Review of Systems   Physical Exam Triage Vital Signs ED Triage Vitals  Enc Vitals Group     BP 08/04/19 1317 112/72     Pulse Rate 08/04/19 1317 100     Resp 08/04/19 1317 16     Temp 08/04/19 1317 97.9 F (36.6 C)     Temp Source 08/04/19 1317 Oral     SpO2 08/04/19 1317 100 %     Weight 08/04/19 1316 127 lb (57.6 kg)     Height --      Head Circumference --      Peak Flow --      Pain Score 08/04/19 1316 10     Pain Loc --      Pain Edu? --      Excl. in [place]? --    No data found.  Updated Vital Signs BP 112/72 (BP Location: Right Arm)   Pulse 100   Temp  97.9 F (36.6 C) (Oral)   Resp 16   Wt 127 lb (57.6 kg)   LMP 07/19/2019   SpO2 100%   BMI 23.23 kg/m   Visual Acuity Right Eye Distance:   Left Eye Distance:   Bilateral Distance:    Right Eye Near:   Left Eye Near:    Bilateral Near:     Physical Exam Vitals signs and nursing note reviewed.  Constitutional:      General: She is not in acute distress.    Appearance: Normal appearance. She is not ill-appearing, toxic-appearing or diaphoretic.  HENT:     Head: Normocephalic and atraumatic.     Nose: Nose normal.  Eyes:     Conjunctiva/sclera: Conjunctivae normal.  Neck:     Musculoskeletal: Normal range of motion.  Pulmonary:     Effort: Pulmonary effort is normal.  Abdominal:     Palpations: Abdomen is soft.     Tenderness: There is no abdominal tenderness.  Genitourinary:    Labia:        Right: Tenderness present.        Comments: Swelling and tenderness to right labia majora.  Mild central fluctuance with scabbing from previous attempt to open area.  Surrounding erythema and induration. Musculoskeletal: Normal range of motion.  Lymphadenopathy:     Lower Body: Right inguinal adenopathy present.  Skin:    General: Skin is warm and dry.  Psychiatric:        Mood and Affect: Mood normal.       UC Treatments / Results  Labs (all labs ordered are listed, but only abnormal results are displayed) Labs Reviewed - No data to display  EKG   Radiology No results found.  Procedures Incision and Drainage  Date/Time: 08/05/2019 11:22 AM Performed by: Janace Aris, NP Authorized by: Janace Aris, NP   Consent:    Consent obtained:  Verbal   Consent given by:  Patient   Risks discussed:  Bleeding, incomplete drainage and pain   Alternatives discussed:  No treatment Universal protocol:    Patient identity confirmed:  Verbally with patient Location:    Type:  Abscess   Size:  5   Location:  Anogenital   Anogenital location:  [place] Pre-procedure details:    Skin preparation:  Betadine Anesthesia (see MAR for exact dosages):    Anesthesia method:  Local infiltration   Local anesthetic:  Lidocaine 1% WITH epi and lidocaine 2% w/o epi Procedure type:    Complexity:  Simple Procedure details:    Incision types:  Single straight   Incision depth:  Subcutaneous   Scalpel blade:  11   Wound management:  Probed and deloculated   Drainage:  Purulent and bloody   Drainage amount:  Scant   Wound treatment:  Wound left open Post-procedure details:    Patient tolerance of procedure:  Tolerated well, no immediate complications   (including critical care time)  Medications Ordered in UC Medications - No data to display  Initial Impression / Assessment and Plan / UC Course  I have reviewed the triage vital signs and the nursing notes.  Pertinent labs & imaging results that were available during my care of the patient were reviewed by me and considered in my medical decision making (see chart for details).     Abscess to right labia majora. Attempted I&D in clinic with minimal thick drainage Left open and covered with gauze. Will place patient on antibiotics and have her do warm  compresses Recommended for continue worsening symptoms she will need to follow-up   Final Clinical Impressions(s) / UC Diagnoses   Final diagnoses:  Abscess     Discharge Instructions     Treating you for infection with keflex 4 x day for 7 days.  Take as prescribed Ibuprofen for pain and warm compresses.  Follow up as needed for continued or worsening symptoms     ED Prescriptions    Medication Sig Dispense Auth. Provider   cephALEXin (KEFLEX) 500 MG capsule Take 1 capsule (500 mg total) by mouth 4 (four) times daily. 28 capsule Dahlia ByesBast, Abygail Galeno A, NP     Controlled Substance Prescriptions [place] Controlled Substance Registry consulted? Not Applicable   Janace ArisBast, Zaylei Mullane A, NP 08/05/19 1124

## 2019-10-10 ENCOUNTER — Ambulatory Visit (HOSPITAL_COMMUNITY)
Admission: EM | Admit: 2019-10-10 | Discharge: 2019-10-10 | Disposition: A | Discharge status: Home / Self Care | Payer: Self-pay | Attending: Family Medicine | Admitting: Family Medicine

## 2019-10-10 ENCOUNTER — Encounter (HOSPITAL_COMMUNITY): Payer: Self-pay | Admitting: Emergency Medicine

## 2019-10-10 ENCOUNTER — Other Ambulatory Visit: Payer: Self-pay

## 2019-10-10 DIAGNOSIS — G44209 Tension-type headache, unspecified, not intractable: Secondary | ICD-10-CM

## 2019-10-10 DIAGNOSIS — M542 Cervicalgia: Secondary | ICD-10-CM

## 2019-10-10 MED ORDER — TIZANIDINE HCL 4 MG PO TABS: 4.0000 mg | ORAL_TABLET | Freq: Four times a day (QID) | ORAL | 0 refills | Status: DC | PRN

## 2019-10-10 MED ORDER — IBUPROFEN 800 MG PO TABS: 800.0000 mg | ORAL_TABLET | Freq: Three times a day (TID) | ORAL | 0 refills | Status: DC
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
## 2019-10-10 NOTE — Discharge Instructions (Addendum)
Take the ibuprofen 3 times a day with food Take muscle relaxer as needed Follow-up with your primary care doctor
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
## 2020-04-10 ENCOUNTER — Encounter (HOSPITAL_COMMUNITY): Payer: Self-pay

## 2020-04-10 ENCOUNTER — Other Ambulatory Visit: Payer: Self-pay

## 2020-04-10 ENCOUNTER — Ambulatory Visit (HOSPITAL_COMMUNITY)
Admission: EM | Admit: 2020-04-10 | Discharge: 2020-04-10 | Disposition: A | Discharge status: Home / Self Care | Payer: Self-pay | Attending: Internal Medicine | Admitting: Internal Medicine

## 2020-04-10 DIAGNOSIS — M778 Other enthesopathies, not elsewhere classified: Secondary | ICD-10-CM

## 2020-04-10 MED ORDER — IBUPROFEN 600 MG PO TABS: 600.0000 mg | ORAL_TABLET | Freq: Four times a day (QID) | ORAL | 0 refills | Status: DC | PRN

## 2020-04-10 MED ORDER — PREDNISONE 20 MG PO TABS: 20.0000 mg | ORAL_TABLET | Freq: Every day | ORAL | 0 refills | Status: AC

## 2020-04-10 NOTE — ED Triage Notes (Signed)
Pt presents with recurrent right elbow pain that radiates up to neck and causes intermittent headache.

## 2020-04-12 NOTE — ED Provider Notes (Signed)
[place]    CSN: [id number] Arrival date & time: 04/10/20  1947      History   Chief Complaint Chief Complaint  Patient presents with  . Elbow Pain  . Neck Pain  . Headache    HPI [name] is a 44 y.o. female with a history of recurrent right elbow pain comes to the urgent care with right elbow pain which radiates up her right arm.   Symptoms started a couple of days ago and has been persistent.  She is taking over-the-counter pain medications with no improvement in her symptoms.  Her job involves repetitive movement of her right hand.  No swelling of the right elbow.  No neck pain or neck stiffness.  No fever or chills.  No trauma to the right elbow.  HPI  Past Medical History:  Diagnosis Date  . Medical history non-contributory     There are no problems to display for this patient.   Past Surgical History:  Procedure Laterality Date  . TUBAL LIGATION      OB History    Gravida  2   Para  2   Term  2   Preterm      AB      Living  2     SAB      TAB      Ectopic      Multiple      Live Births  2            Home Medications    Prior to Admission medications   Medication Sig Start Date End Date Taking? Authorizing Provider  aspirin-acetaminophen-caffeine (EXCEDRIN MIGRAINE) [phone] MG tablet Take 2 tablets by mouth every 6 (six) hours as needed for headache.    [provider]  Aspirin-Salicylamide-Caffeine (BC HEADACHE POWDER PO) Take 2 packets by mouth.    [provider]  ibuprofen (ADVIL) 600 MG tablet Take 1 tablet (600 mg total) by mouth every 6 (six) hours as needed. 04/10/20   Riane Rung, Myrene Galas, MD  predniSONE (DELTASONE) 20 MG tablet Take 1 tablet (20 mg total) by mouth daily for 5 days. 04/10/20 04/15/20  LampteyMyrene Galas, MD  tiZANidine (ZANAFLEX) 4 MG tablet Take 1-2 tablets (4-8 mg total) by mouth every 6 (six) hours as needed for muscle spasms. 10/10/19   Raylene Everts, MD     Family History Family History  Family history unknown: Yes    Social History Social History   Tobacco Use  . Smoking status: Current Every Day Smoker    Packs/day: 0.50    Types: Cigarettes  . Smokeless tobacco: Never Used  Substance Use Topics  . Alcohol use: Yes    Comment: occ  . Drug use: Yes    Types: Marijuana     Allergies   Patient has no known allergies.   Review of Systems Review of Systems  Constitutional: Negative for activity change, chills and fatigue.  Respiratory: Negative.   Gastrointestinal: Negative.   Genitourinary: Negative.   Musculoskeletal: Positive for arthralgias and myalgias. Negative for joint swelling.  Skin: Negative.   Neurological: Negative.   Psychiatric/Behavioral: Negative.      Physical Exam Triage Vital Signs ED Triage Vitals  Enc Vitals Group     BP 04/10/20 2000 108/72     Pulse Rate 04/10/20 2000 95     Resp 04/10/20 2000 18     Temp 04/10/20 2000 98.1 F (36.7 C)     Temp  Source 04/10/20 2000 Oral     SpO2 04/10/20 2000 95 %     Weight --      Height --      Head Circumference --      Peak Flow --      Pain Score 04/10/20 1959 7     Pain Loc --      Pain Edu? --      Excl. in [place]? --    No data found.  Updated Vital Signs BP 108/72 (BP Location: Right Arm)   Pulse 95   Temp 98.1 F (36.7 C) (Oral)   Resp 18   SpO2 95%   Visual Acuity Right Eye Distance:   Left Eye Distance:   Bilateral Distance:    Right Eye Near:   Left Eye Near:    Bilateral Near:     Physical Exam Pulmonary:     Effort: Pulmonary effort is normal.     Breath sounds: Normal breath sounds.  Abdominal:     General: Bowel sounds are normal.     Palpations: Abdomen is soft.  Musculoskeletal:     Cervical back: Normal range of motion and neck supple. No rigidity.  Lymphadenopathy:     Cervical: No cervical adenopathy.  Skin:    General: Skin is warm and dry.     Capillary Refill: Capillary refill takes less than 2  seconds.  Neurological:     Mental Status: She is alert and oriented to person, place, and time.     GCS: GCS eye subscore is 4. GCS verbal subscore is 5. GCS motor subscore is 6.     Sensory: No sensory deficit.     Motor: No weakness.     Comments: Tenderness over the lateral epicondyle.  Full range of motion of the right elbow.  No swelling.  No tenderness over the olecranon process.      UC Treatments / Results  Labs (all labs ordered are listed, but only abnormal results are displayed) Labs Reviewed - No data to display  EKG   Radiology No results found.  Procedures Procedures (including critical care time)  Medications Ordered in UC Medications - No data to display  Initial Impression / Assessment and Plan / UC Course  I have reviewed the triage vital signs and the nursing notes.  Pertinent labs & imaging results that were available during my care of the patient were reviewed by me and considered in my medical decision making (see chart for details).     Tendinitis of the left elbow: Prednisone 20 mg orally daily for 5 days Ibuprofen 600 mg as needed for pain. Return precautions given No indication for x-rays at this time If patient's symptoms worsen she is advised to return to urgent care to be reevaluated Work note given.   Final Clinical Impressions(s) / UC Diagnoses   Final diagnoses:  Tendinitis of right elbow   Discharge Instructions   None    ED Prescriptions    Medication Sig Dispense Auth. Provider   predniSONE (DELTASONE) 20 MG tablet Take 1 tablet (20 mg total) by mouth daily for 5 days. 5 tablet Gabrianna Fassnacht, Britta Mccreedy, MD   ibuprofen (ADVIL) 600 MG tablet Take 1 tablet (600 mg total) by mouth every 6 (six) hours as needed. 30 tablet Vincenzina Jagoda, Britta Mccreedy, MD     PDMP not reviewed this encounter.   Merrilee Jansky, MD 04/12/20 1343

## 2020-06-23 ENCOUNTER — Other Ambulatory Visit: Payer: Self-pay

## 2020-06-23 ENCOUNTER — Encounter (HOSPITAL_COMMUNITY): Payer: Self-pay | Admitting: Emergency Medicine

## 2020-06-23 ENCOUNTER — Ambulatory Visit (HOSPITAL_COMMUNITY)
Admission: EM | Admit: 2020-06-23 | Discharge: 2020-06-23 | Disposition: A | Discharge status: Home / Self Care | Payer: Self-pay | Attending: Family Medicine | Admitting: Family Medicine

## 2020-06-23 DIAGNOSIS — R0789 Other chest pain: Secondary | ICD-10-CM

## 2020-06-23 MED ORDER — NAPROXEN 500 MG PO TABS: 500.0000 mg | ORAL_TABLET | Freq: Two times a day (BID) | ORAL | 0 refills | Status: DC

## 2020-06-23 MED ORDER — KETOROLAC TROMETHAMINE 30 MG/ML IJ SOLN
30.0000 mg | Freq: Once | INTRAMUSCULAR | Status: AC
  Administered 2020-06-23: 30 mg via INTRAMUSCULAR

## 2020-06-23 MED ORDER — KETOROLAC TROMETHAMINE 30 MG/ML IJ SOLN
INTRAMUSCULAR | Status: AC
  Filled 2020-06-23: qty 1

## 2020-06-23 MED ORDER — CYCLOBENZAPRINE HCL 5 MG PO TABS: 5.0000 mg | ORAL_TABLET | Freq: Two times a day (BID) | ORAL | 0 refills | Status: DC | PRN

## 2020-06-23 NOTE — ED Triage Notes (Signed)
Pt c/o mid chest pain onset yesterday. She states she was helping a friend move and she lifted a box that was about 70 lbs. She states she is having trouble breathing and it hurts to laugh. Pt states she took ibuprofen but it did not help.

## 2020-06-23 NOTE — Discharge Instructions (Signed)
We gave you a shot of Toradol Begin Naprosyn twice daily with food beginning tomorrow You may use flexeril as needed to help with pain. This is a muscle relaxer and causes sedation- please use only at bedtime or when you will be home and not have to drive/work Gentle stretching Alternate ice and heat Follow-up if not improving or worsening

## 2020-06-24 NOTE — ED Provider Notes (Signed)
[facility]    CSN: [id number] Arrival date & time: 06/23/20  1727      History   Chief Complaint Chief Complaint  Patient presents with  . Chest Pain    HPI [name] is a 44 y.o. female presenting today for evaluation of chest pain.  Patient reports that yesterday she was helping a friend move and lifted a box that was approximately 70 pounds.  She felt a pulling sensation in her lower middle chest.  Since she has had increased pain especially with bending, breathing and laughing.  She denies any abdominal pain nausea or vomiting.  She took ibuprofen 800 mg without relief.  She denies any specific injury or trauma directly to the chest.  HPI  Past Medical History:  Diagnosis Date  . Medical history non-contributory     There are no problems to display for this patient.   Past Surgical History:  Procedure Laterality Date  . TUBAL LIGATION      OB History    Gravida  2   Para  2   Term  2   Preterm      AB      Living  2     SAB      TAB      Ectopic      Multiple      Live Births  2            Home Medications    Prior to Admission medications   Medication Sig Start Date End Date Taking? Authorizing Provider  cyclobenzaprine (FLEXERIL) 5 MG tablet Take 1-2 tablets (5-10 mg total) by mouth 2 (two) times daily as needed for muscle spasms. 06/23/20   Kamorah Nevils C, PA-C  naproxen (NAPROSYN) 500 MG tablet Take 1 tablet (500 mg total) by mouth 2 (two) times daily. 06/23/20   Zyniah Ferraiolo, Junius Creamer, PA-C    Family History Family History  Family history unknown: Yes    Social History Social History   Tobacco Use  . Smoking status: Current Every Day Smoker    Packs/day: 0.50    Types: Cigarettes  . Smokeless tobacco: Never Used  Vaping Use  . Vaping Use: Never used  Substance Use Topics  . Alcohol use: Yes    Comment: occ  . Drug use: Yes    Types: Marijuana     Allergies   Patient has no known  allergies.   Review of Systems Review of Systems  Constitutional: Negative for activity change, appetite change, chills, fatigue and fever.  HENT: Negative for congestion, ear pain, rhinorrhea, sinus pressure, sore throat and trouble swallowing.   Eyes: Negative for discharge and redness.  Respiratory: Negative for cough, chest tightness and shortness of breath.   Cardiovascular: Positive for chest pain.  Gastrointestinal: Negative for abdominal pain, diarrhea, nausea and vomiting.  Musculoskeletal: Negative for myalgias.  Skin: Negative for rash.  Neurological: Negative for dizziness, light-headedness and headaches.     Physical Exam Triage Vital Signs ED Triage Vitals  Enc Vitals Group     BP 06/23/20 1917 (!) 116/88     Pulse Rate 06/23/20 1917 87     Resp 06/23/20 1917 16     Temp 06/23/20 1917 98.2 F (36.8 C)     Temp Source 06/23/20 1917 Oral     SpO2 06/23/20 1917 100 %     Weight --      Height --      Head Circumference --  Peak Flow --      Pain Score 06/23/20 1914 8     Pain Loc --      Pain Edu? --      Excl. in GC? --    No data found.  Updated Vital Signs BP (!) 116/88 (BP Location: Right Arm)   Pulse 87   Temp 98.2 F (36.8 C) (Oral)   Resp 16   LMP 06/16/2020   SpO2 100%   Visual Acuity Right Eye Distance:   Left Eye Distance:   Bilateral Distance:    Right Eye Near:   Left Eye Near:    Bilateral Near:     Physical Exam Vitals and nursing note reviewed.  Constitutional:      Appearance: She is well-developed.     Comments: No acute distress  HENT:     Head: Normocephalic and atraumatic.     Nose: Nose normal.  Eyes:     Conjunctiva/sclera: Conjunctivae normal.  Cardiovascular:     Rate and Rhythm: Normal rate.  Pulmonary:     Effort: Pulmonary effort is normal. No respiratory distress.     Comments: Breathing comfortably at rest, CTABL, no wheezing, rales or other adventitious sounds auscultated  Central chest tenderness to  palpation to lower sternum and parasternal areas nontender extending into ribs or breasts, no superior chest tenderness Chest:     Chest wall: Tenderness present.    Abdominal:     General: There is no distension.     Comments: Abdomen soft, nondistended, nontender to light deep palpation throughout abdomen  Musculoskeletal:        General: Normal range of motion.     Cervical back: Neck supple.  Skin:    General: Skin is warm and dry.  Neurological:     Mental Status: She is alert and oriented to person, place, and time.      UC Treatments / Results  Labs (all labs ordered are listed, but only abnormal results are displayed) Labs Reviewed - No data to display  EKG   Radiology No results found.  Procedures Procedures (including critical care time)  Medications Ordered in UC Medications  ketorolac (TORADOL) 30 MG/ML injection 30 mg (30 mg Intramuscular Given 06/23/20 1949)    Initial Impression / Assessment and Plan / UC Course  I have reviewed the triage vital signs and the nursing notes.  Pertinent labs & imaging results that were available during my care of the patient were reviewed by me and considered in my medical decision making (see chart for details).     Suspect likely muscular strain versus costochondritis, do not suspect underlying rib fracture.  Provided Toradol prior to discharge and recommending to continue NSAIDs, may try muscle relaxers.  Activity modification.  Monitor for gradual self resolution.  Discussed strict return precautions. Patient verbalized understanding and is agreeable with plan.  Final Clinical Impressions(s) / UC Diagnoses   Final diagnoses:  Chest wall pain     Discharge Instructions     We gave you a shot of Toradol Begin Naprosyn twice daily with food beginning tomorrow You may use flexeril as needed to help with pain. This is a muscle relaxer and causes sedation- please use only at bedtime or when you will be home and  not have to drive/work Gentle stretching Alternate ice and heat Follow-up if not improving or worsening   ED Prescriptions    Medication Sig Dispense Auth. Provider   naproxen (NAPROSYN) 500 MG tablet Take 1 tablet (  500 mg total) by mouth 2 (two) times daily. 30 tablet Mikelle Myrick C, PA-C   cyclobenzaprine (FLEXERIL) 5 MG tablet Take 1-2 tablets (5-10 mg total) by mouth 2 (two) times daily as needed for muscle spasms. 24 tablet Sante Biedermann, [place] C, PA-C     PDMP not reviewed this encounter.   Lew Dawes, PA-C 06/24/20 1012

## 2021-02-23 ENCOUNTER — Ambulatory Visit (HOSPITAL_COMMUNITY)
Admission: EM | Admit: 2021-02-23 | Discharge: 2021-02-23 | Disposition: A | Discharge status: Home / Self Care | Payer: Self-pay | Attending: Family Medicine | Admitting: Family Medicine

## 2021-02-23 ENCOUNTER — Encounter (HOSPITAL_COMMUNITY): Payer: Self-pay | Admitting: Emergency Medicine

## 2021-02-23 ENCOUNTER — Other Ambulatory Visit: Payer: Self-pay

## 2021-02-23 DIAGNOSIS — M778 Other enthesopathies, not elsewhere classified: Secondary | ICD-10-CM

## 2021-02-23 MED ORDER — CYCLOBENZAPRINE HCL 5 MG PO TABS: 5.0000 mg | ORAL_TABLET | Freq: Two times a day (BID) | ORAL | 0 refills | Status: DC | PRN

## 2021-02-23 MED ORDER — NAPROXEN 500 MG PO TABS: 500.0000 mg | ORAL_TABLET | Freq: Two times a day (BID) | ORAL | 0 refills | Status: DC

## 2021-02-23 NOTE — ED Triage Notes (Signed)
Pt presents with right shoulder and elbow pain. States has hx of golfers elbow and has to do heavy lifting at work.

## 2021-02-23 NOTE — Discharge Instructions (Signed)
Stretch well before your work shifts, can try Kinesiotape in addition to the medications prescribed

## 2021-02-25 NOTE — ED Provider Notes (Signed)
[facility]    CSN: [id number] Arrival date & time: 02/23/21  1843      History   Chief Complaint Chief Complaint  Patient presents with  . Shoulder Pain  . Joint Swelling    HPI [name] is a 45 y.o. female.   Patient presenting today with right shoulder and right elbow pain that she believes to be related to her heavy lifting at work.  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  She denies swelling, discoloration, numbness, tingling.  Was diagnosed with tendinitis of the elbow several times before and states this is similar.  Her work required her to be seen today and evaluated.  Has been trying ice and rest at home without relief.     Past Medical History:  Diagnosis Date  . Medical history non-contributory     There are no problems to display for this patient.   Past Surgical History:  Procedure Laterality Date  . TUBAL LIGATION      OB History    Gravida  2   Para  2   Term  2   Preterm      AB      Living  2     SAB      IAB      Ectopic      Multiple      Live Births  2            Home Medications    Prior to Admission medications   Medication Sig Start Date End Date Taking? Authorizing Provider  cyclobenzaprine (FLEXERIL) 5 MG tablet Take 1-2 tablets (5-10 mg total) by mouth 2 (two) times daily as needed for muscle spasms. 02/23/21   Particia Nearing, PA-C  naproxen (NAPROSYN) 500 MG tablet Take 1 tablet (500 mg total) by mouth 2 (two) times daily. 02/23/21   Particia Nearing, PA-C    Family History Family History  Family history unknown: Yes    Social History Social History   Tobacco Use  . Smoking status: Current Every Day Smoker    Packs/day: 0.50    Types: Cigarettes  . Smokeless tobacco: Never Used  Vaping Use  . Vaping Use: Never used  Substance Use Topics  . Alcohol use: Yes    Comment: occ  . Drug use: Yes    Types: Marijuana     Allergies   Patient  has no known allergies.   Review of Systems Review of Systems Per HPI  Physical Exam Triage Vital Signs ED Triage Vitals  Enc Vitals Group     BP 02/23/21 1902 123/76     Pulse Rate 02/23/21 1902 93     Resp 02/23/21 1902 18     Temp 02/23/21 1902 98.6 F (37 C)     Temp Source 02/23/21 1902 Oral     SpO2 02/23/21 1902 96 %     Weight --      Height --      Head Circumference --      Peak Flow --      Pain Score 02/23/21 1900 7     Pain Loc --      Pain Edu? --      Excl. in GC? --    No data found.  Updated Vital Signs BP 123/76 (BP Location: Left Arm)   Pulse 93   Temp 98.6 F (37 C) (Oral)   Resp 18   LMP 02/10/2021  SpO2 96%   Visual Acuity Right Eye Distance:   Left Eye Distance:   Bilateral Distance:    Right Eye Near:   Left Eye Near:    Bilateral Near:     Physical Exam Vitals and nursing note reviewed.  Constitutional:      Appearance: Normal appearance. She is not ill-appearing.  HENT:     Head: Atraumatic.  Eyes:     Extraocular Movements: Extraocular movements intact.     Conjunctiva/sclera: Conjunctivae normal.  Cardiovascular:     Rate and Rhythm: Normal rate and regular rhythm.     Heart sounds: Normal heart sounds.  Pulmonary:     Effort: Pulmonary effort is normal.     Breath sounds: Normal breath sounds.  Musculoskeletal:        General: Normal range of motion.     Cervical back: Normal range of motion and neck supple.     Comments: Subjective pain with range of motion against resistance exercises right arm Tender to palpation forearm and bicep musculature right arm  Skin:    General: Skin is warm and dry.  Neurological:     Mental Status: She is alert and oriented to person, place, and time.     Comments: Right upper extremity neurovascularly intact  Psychiatric:        Mood and Affect: Mood normal.        Thought Content: Thought content normal.        Judgment: Judgment normal.      UC Treatments / Results   Labs (all labs ordered are listed, but only abnormal results are displayed) Labs Reviewed - No data to display  EKG   Radiology No results found.  Procedures Procedures (including critical care time)  Medications Ordered in UC Medications - No data to display  Initial Impression / Assessment and Plan / UC Course  I have reviewed the triage vital signs and the nursing notes.  Pertinent labs & imaging results that were available during my care of the patient were reviewed by me and considered in my medical decision making (see chart for details).     Suspect recurrence of elbow tendinitis secondary to her job duties.  Will treat with naproxen, Flexeril, stretches, ice.  Discussed exercises for supportive muscles and functional movements to help.  Sports med follow-up if not resolving.  Final Clinical Impressions(s) / UC Diagnoses   Final diagnoses:  Right elbow tendonitis     Discharge Instructions     Stretch well before your work shifts, can try Kinesiotape in addition to the medications prescribed    ED Prescriptions    Medication Sig Dispense Auth. Provider   cyclobenzaprine (FLEXERIL) 5 MG tablet Take 1-2 tablets (5-10 mg total) by mouth 2 (two) times daily as needed for muscle spasms. 24 tablet Particia Nearing, [place]   naproxen (NAPROSYN) 500 MG tablet Take 1 tablet (500 mg total) by mouth 2 (two) times daily. 30 tablet Particia Nearing, [place]     PDMP not reviewed this encounter.   Particia Nearing, [place] 02/25/21 1301

## 2021-04-09 ENCOUNTER — Other Ambulatory Visit: Payer: Self-pay

## 2021-04-09 ENCOUNTER — Encounter (HOSPITAL_COMMUNITY): Payer: Self-pay

## 2021-04-09 ENCOUNTER — Ambulatory Visit (HOSPITAL_COMMUNITY)
Admission: EM | Admit: 2021-04-09 | Discharge: 2021-04-09 | Disposition: A | Discharge status: Home / Self Care | Payer: Self-pay | Attending: Family Medicine | Admitting: Family Medicine

## 2021-04-09 DIAGNOSIS — H9201 Otalgia, right ear: Secondary | ICD-10-CM

## 2021-04-09 DIAGNOSIS — J029 Acute pharyngitis, unspecified: Secondary | ICD-10-CM

## 2021-04-09 DIAGNOSIS — J069 Acute upper respiratory infection, unspecified: Secondary | ICD-10-CM

## 2021-04-09 MED ORDER — PSEUDOEPHEDRINE HCL ER 120 MG PO TB12: 120.0000 mg | ORAL_TABLET | Freq: Two times a day (BID) | ORAL | 0 refills | Status: DC | PRN

## 2021-04-09 MED ORDER — LEVOCETIRIZINE DIHYDROCHLORIDE 5 MG PO TABS: 5.0000 mg | ORAL_TABLET | Freq: Every evening | ORAL | 0 refills | Status: DC

## 2021-04-09 MED ORDER — IPRATROPIUM BROMIDE 0.03 % NA SOLN: 2.0000 | Freq: Three times a day (TID) | NASAL | 0 refills | Status: DC

## 2021-04-09 NOTE — ED Provider Notes (Signed)
[facility]    CSN: [id number] Arrival date & time: 04/09/21  1610      History   Chief Complaint Chief Complaint  Patient presents with  . Headache  . Otalgia  . Sore Throat    HPI [name] is a 45 y.o. female.   HPI  Patient presents with URI symptoms including facial pressure, headache, sore throat, otalgia, and nasal congestion. No known sick contacts. No fever, body aches, n&V.  Past Medical History:  Diagnosis Date  . Medical history non-contributory     There are no problems to display for this patient.   Past Surgical History:  Procedure Laterality Date  . TUBAL LIGATION      OB History    Gravida  2   Para  2   Term  2   Preterm      AB      Living  2     SAB      IAB      Ectopic      Multiple      Live Births  2            Home Medications    Prior to Admission medications   Medication Sig Start Date End Date Taking? Authorizing Provider  cyclobenzaprine (FLEXERIL) 5 MG tablet Take 1-2 tablets (5-10 mg total) by mouth 2 (two) times daily as needed for muscle spasms. 02/23/21   Particia Nearing, PA-C  naproxen (NAPROSYN) 500 MG tablet Take 1 tablet (500 mg total) by mouth 2 (two) times daily. 02/23/21   Particia Nearing, PA-C    Family History Family History  Family history unknown: Yes    Social History Social History   Tobacco Use  . Smoking status: Current Every Day Smoker    Packs/day: 0.50    Types: Cigarettes  . Smokeless tobacco: Never Used  Vaping Use  . Vaping Use: Never used  Substance Use Topics  . Alcohol use: Yes    Comment: occ  . Drug use: Yes    Types: Marijuana     Allergies   Patient has no known allergies.   Review of Systems Review of Systems Pertinent negatives listed in HPI   Physical Exam Triage Vital Signs ED Triage Vitals  Enc Vitals Group     BP 04/09/21 1703 122/79     Pulse Rate 04/09/21 1703 100     Resp 04/09/21 1703 17     Temp  04/09/21 1703 98.3 F (36.8 C)     Temp Source 04/09/21 1703 Oral     SpO2 04/09/21 1703 98 %     Weight --      Height --      Head Circumference --      Peak Flow --      Pain Score 04/09/21 1701 6     Pain Loc --      Pain Edu? --      Excl. in GC? --    No data found.  Updated Vital Signs BP 122/79 (BP Location: Right Arm)   Pulse 100   Temp 98.3 F (36.8 C) (Oral)   Resp 17   LMP 04/06/2021 (Exact Date)   SpO2 98%   Visual Acuity Right Eye Distance:   Left Eye Distance:   Bilateral Distance:    Right Eye Near:   Left Eye Near:    Bilateral Near:     Physical Exam  General Appearance:  Alert, cooperative, no distress  HENT:   Normocephalic, B/L ears normal, nares mucosal edema with congestion, rhinorrhea, oropharynx without erythema or swelling   Eyes:    PERRL, conjunctiva/corneas clear, EOM's intact       Lungs:     Clear to auscultation bilaterally, respirations unlabored  Heart:    Regular rate and rhythm  Neurologic:   Awake, alert, oriented x 3. No apparent focal neurological           defect.     UC Treatments / Results  Labs (all labs ordered are listed, but only abnormal results are displayed) Labs Reviewed - No data to display  EKG   Radiology No results found.  Procedures Procedures (including critical care time)  Medications Ordered in UC Medications - No data to display  Initial Impression / Assessment and Plan / UC Course  I have reviewed the triage vital signs and the nursing notes.  Pertinent labs & imaging results that were available during my care of the patient were reviewed by me and considered in my medical decision making (see chart for details).    Viral URI with otalgia and ST. Symptoms management warranted only. Treatment per medication discharge. PCP follow-up as needed. Final Clinical Impressions(s) / UC Diagnoses   Final diagnoses:  Viral upper respiratory illness  Right ear pain  Sore throat   Discharge  Instructions   None    ED Prescriptions    Medication Sig Dispense Auth. Provider   pseudoephedrine (SUDAFED 12 HOUR) 120 MG 12 hr tablet Take 1 tablet (120 mg total) by mouth every 12 (twelve) hours as needed for congestion (sinus headache and nasal congestion). 30 tablet Bing Neighbors, FNP   levocetirizine (XYZAL) 5 MG tablet Take 1 tablet (5 mg total) by mouth every evening. 30 tablet Bing Neighbors, FNP   ipratropium (ATROVENT) 0.03 % nasal spray Place 2 sprays into both nostrils 3 (three) times daily. 30 mL Bing Neighbors, FNP     PDMP not reviewed this encounter.   Bing Neighbors, FNP 04/16/21 1121

## 2021-04-09 NOTE — ED Triage Notes (Signed)
Pt c/o headache x 2 days. She states she has ear pain and sore throat. She states she has been sensitive to the light and states she has been coughing up yellow mucus.

## 2021-06-11 ENCOUNTER — Encounter (HOSPITAL_COMMUNITY): Payer: Self-pay

## 2021-06-11 ENCOUNTER — Other Ambulatory Visit: Payer: Self-pay

## 2021-06-11 ENCOUNTER — Ambulatory Visit (HOSPITAL_COMMUNITY)
Admission: EM | Admit: 2021-06-11 | Discharge: 2021-06-11 | Disposition: A | Discharge status: Home / Self Care | Payer: Self-pay | Attending: Emergency Medicine | Admitting: Emergency Medicine

## 2021-06-11 DIAGNOSIS — M7701 Medial epicondylitis, right elbow: Secondary | ICD-10-CM

## 2021-06-11 MED ORDER — MELOXICAM 7.5 MG PO TABS: 7.5000 mg | ORAL_TABLET | Freq: Every day | ORAL | 0 refills | Status: DC

## 2021-06-11 MED ORDER — KETOROLAC TROMETHAMINE 30 MG/ML IJ SOLN
INTRAMUSCULAR | Status: AC
  Filled 2021-06-11: qty 1

## 2021-06-11 MED ORDER — PREDNISONE 20 MG PO TABS: 40.0000 mg | ORAL_TABLET | Freq: Every day | ORAL | 0 refills | Status: DC

## 2021-06-11 MED ORDER — KETOROLAC TROMETHAMINE 30 MG/ML IJ SOLN
30.0000 mg | Freq: Once | INTRAMUSCULAR | Status: AC
  Administered 2021-06-11: 30 mg via INTRAMUSCULAR

## 2021-06-11 NOTE — Discharge Instructions (Addendum)
Start taking prednisone  tomorrow, 2 pills in the morning with food for 5 days  At completion can take meloxicam for 5 days then as needed   Gentle stretching arm as pain lessens  Can use heating pad in 15 minute intervals  For persistent pain follow up with orthopedic specialty information below

## 2021-06-11 NOTE — ED Triage Notes (Signed)
Pt presents with right elbow pain x 1 day, after holding her 8 lbs nephew.

## 2021-06-11 NOTE — ED Provider Notes (Signed)
[facility]    CSN: [id number] Arrival date & time: 06/11/21  1638      History   Chief Complaint Chief Complaint  Patient presents with   Elbow Pain    HPI [name] is a 45 y.o. female.   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 ROM intact but painful on flexion, extension and outward rotation. Denies numbness or tingling. Has history of right elbow tendinitis. Attempted use of tylenol and ibuprofen with no relief.   Past Medical History:  Diagnosis Date   Medical history non-contributory     There are no problems to display for this patient.   Past Surgical History:  Procedure Laterality Date   TUBAL LIGATION      OB History     Gravida  2   Para  2   Term  2   Preterm      AB      Living  2      SAB      IAB      Ectopic      Multiple      Live Births  2            Home Medications    Prior to Admission medications   Medication Sig Start Date End Date Taking? Authorizing Provider  cyclobenzaprine (FLEXERIL) 5 MG tablet Take 1-2 tablets (5-10 mg total) by mouth 2 (two) times daily as needed for muscle spasms. 02/23/21   Particia Nearing, PA-C  ipratropium (ATROVENT) 0.03 % nasal spray Place 2 sprays into both nostrils 3 (three) times daily. 04/09/21   Bing Neighbors, FNP  levocetirizine (XYZAL) 5 MG tablet Take 1 tablet (5 mg total) by mouth every evening. 04/09/21   Bing Neighbors, FNP  naproxen (NAPROSYN) 500 MG tablet Take 1 tablet (500 mg total) by mouth 2 (two) times daily. 02/23/21   Particia Nearing, PA-C  pseudoephedrine (SUDAFED 12 HOUR) 120 MG 12 hr tablet Take 1 tablet (120 mg total) by mouth every 12 (twelve) hours as needed for congestion (sinus headache and nasal congestion). 04/09/21   Bing Neighbors, FNP    Family History Family History  Family history unknown: Yes    Social  History Social History   Tobacco Use   Smoking status: Every Day    Packs/day: 0.50    Pack years: 0.00    Types: Cigarettes   Smokeless tobacco: Never  Vaping Use   Vaping Use: Never used  Substance Use Topics   Alcohol use: Yes    Comment: occ   Drug use: Yes    Types: Marijuana     Allergies   Patient has no known allergies.   Review of Systems Review of Systems Defer to HPI    Physical Exam Triage Vital Signs ED Triage Vitals  Enc Vitals Group     BP 06/11/21 1858 127/89     Pulse Rate 06/11/21 1858 76     Resp 06/11/21 1858 18     Temp 06/11/21 1858 97.9 F (36.6 C)     Temp Source 06/11/21 1858 Oral     SpO2 06/11/21 1858 100 %     Weight --      Height --      Head Circumference --      Peak Flow --      Pain  Score 06/11/21 1900 9     Pain Loc --      Pain Edu? --      Excl. in GC? --    No data found.  Updated Vital Signs BP 127/89 (BP Location: Left Arm)   Pulse 76   Temp 97.9 F (36.6 C) (Oral)   Resp 18   SpO2 100%   Visual Acuity Right Eye Distance:   Left Eye Distance:   Bilateral Distance:    Right Eye Near:   Left Eye Near:    Bilateral Near:     Physical Exam Constitutional:      Appearance: Normal appearance. She is normal weight.  HENT:     Head: Normocephalic.  Eyes:     Extraocular Movements: Extraocular movements intact.  Pulmonary:     Effort: Pulmonary effort is normal.  Musculoskeletal:     Comments: Tenderness with mild swelling over the lateral epicondyle, tenderness over the extensor muscle, ROM intact, pain on extension of arm, 2+ brachial pulse   Skin:    General: Skin is warm and dry.  Neurological:     Mental Status: She is alert and oriented to person, place, and time. Mental status is at baseline.  Psychiatric:        Mood and Affect: Mood normal.        Behavior: Behavior normal.     UC Treatments / Results  Labs (all labs ordered are listed, but only abnormal results are displayed) Labs  Reviewed - No data to display  EKG   Radiology No results found.  Procedures Procedures (including critical care time)  Medications Ordered in UC Medications  ketorolac (TORADOL) 30 MG/ML injection 30 mg (has no administration in time range)    Initial Impression / Assessment and Plan / UC Course  I have reviewed the triage vital signs and the nursing notes.  Pertinent labs & imaging results that were available during my care of the patient were reviewed by me and considered in my medical decision making (see chart for details).  Golfer's elbow right side  Toradol 30 mg IM now Prednisone 40 mg daily for 5 days Meloxicam 7.5 mg daily for 5 days then prn at completion of steroid Gentle stretching as tolerated Heating pad 15 minute intervals  Ortho follow up for persistent symptoms  Final Clinical Impressions(s) / UC Diagnoses   Final diagnoses:  Golfer's elbow, right   Discharge Instructions   None    ED Prescriptions   None    PDMP not reviewed this encounter.   Valinda Hoar, NP 06/11/21 1936

## 2021-11-10 ENCOUNTER — Ambulatory Visit (HOSPITAL_COMMUNITY)
Admission: EM | Admit: 2021-11-10 | Discharge: 2021-11-10 | Disposition: A | Discharge status: Home / Self Care | Payer: Self-pay | Attending: Emergency Medicine | Admitting: Emergency Medicine

## 2021-11-10 ENCOUNTER — Other Ambulatory Visit: Payer: Self-pay

## 2021-11-10 ENCOUNTER — Encounter (HOSPITAL_COMMUNITY): Payer: Self-pay | Admitting: *Deleted

## 2021-11-10 DIAGNOSIS — M5432 Sciatica, left side: Secondary | ICD-10-CM

## 2021-11-10 DIAGNOSIS — M7062 Trochanteric bursitis, left hip: Secondary | ICD-10-CM

## 2021-11-10 MED ORDER — METHOCARBAMOL 500 MG PO TABS: 500.0000 mg | ORAL_TABLET | Freq: Four times a day (QID) | ORAL | 0 refills | Status: DC

## 2021-11-10 MED ORDER — MELOXICAM 15 MG PO TABS: 15.0000 mg | ORAL_TABLET | Freq: Every day | ORAL | 0 refills | Status: DC

## 2021-11-10 NOTE — ED Triage Notes (Signed)
Lt hip pain for 2-3 days. Pt denies any injury.

## 2021-11-10 NOTE — ED Provider Notes (Signed)
[facility]  ____________________________________________  Time seen: Approximately 5:59 PM  I have reviewed the triage vital signs and the nursing notes.   HISTORY  Chief Complaint Hip Pain (LT)    HPI [name] is a 45 y.o. female who presents emergency department complaining of hip pain that is now radiating from her back into her leg.  She states that she works for [employer], lifts heavy boxes all the time.  She states that she started to notice that her lateral hip was hurting, the more she was on that she is now having pain extending into her back and down her leg.  No history of sciatica.  Patient denies any back pain itself, just radiating from her hip up into her back.  Patient denies any urinary symptoms.  No trauma.       Past Medical History:  Diagnosis Date   Medical history non-contributory     There are no problems to display for this patient.   Past Surgical History:  Procedure Laterality Date   TUBAL LIGATION      Prior to Admission medications   Medication Sig Start Date End Date Taking? Authorizing Provider  meloxicam (MOBIC) 15 MG tablet Take 1 tablet (15 mg total) by mouth daily. 11/10/21  Yes Charlea Nardo, Delorise Royals, PA-C  methocarbamol (ROBAXIN) 500 MG tablet Take 1 tablet (500 mg total) by mouth 4 (four) times daily. 11/10/21  Yes Captola Teschner, Delorise Royals, PA-C  cyclobenzaprine (FLEXERIL) 5 MG tablet Take 1-2 tablets (5-10 mg total) by mouth 2 (two) times daily as needed for muscle spasms. 02/23/21   Particia Nearing, PA-C  ipratropium (ATROVENT) 0.03 % nasal spray Place 2 sprays into both nostrils 3 (three) times daily. 04/09/21   Bing Neighbors, FNP  levocetirizine (XYZAL) 5 MG tablet Take 1 tablet (5 mg total) by mouth every evening. 04/09/21   Bing Neighbors, FNP  naproxen (NAPROSYN) 500 MG tablet Take 1 tablet (500 mg total) by mouth 2 (two) times daily. 02/23/21   Particia Nearing, PA-C  predniSONE (DELTASONE) 20 MG  tablet Take 2 tablets (40 mg total) by mouth daily. 06/11/21   White, Elita Boone, NP  pseudoephedrine (SUDAFED 12 HOUR) 120 MG 12 hr tablet Take 1 tablet (120 mg total) by mouth every 12 (twelve) hours as needed for congestion (sinus headache and nasal congestion). 04/09/21   Bing Neighbors, FNP    Allergies Patient has no known allergies.  Family History  Family history unknown: Yes    Social History Social History   Tobacco Use   Smoking status: Every Day    Packs/day: 0.50    Types: Cigarettes   Smokeless tobacco: Never  Vaping Use   Vaping Use: Never used  Substance Use Topics   Alcohol use: Yes    Comment: occ   Drug use: Yes    Types: Marijuana     Review of Systems  Constitutional: No fever/chills Eyes: No visual changes. No discharge ENT: No upper respiratory complaints. Cardiovascular: no chest pain. Respiratory: no cough. No SOB. Gastrointestinal: No abdominal pain.  No nausea, no vomiting.  No diarrhea.  No constipation. Musculoskeletal: Pain rating up into the buttocks and down the left leg Skin: Negative for rash, abrasions, lacerations, ecchymosis. Neurological: Negative for headaches, focal weakness or numbness.  10 System ROS otherwise negative.  ____________________________________________   PHYSICAL EXAM:  VITAL SIGNS: ED Triage Vitals  Enc Vitals Group     BP 11/10/21 1719 104/71  Pulse Rate 11/10/21 1719 96     Resp 11/10/21 1719 18     Temp 11/10/21 1719 98.1 F (36.7 C)     Temp src --      SpO2 11/10/21 1719 100 %     Weight --      Height --      Head Circumference --      Peak Flow --      Pain Score 11/10/21 1715 8     Pain Loc --      Pain Edu? --      Excl. in GC? --      Constitutional: Alert and oriented. Well appearing and in no acute distress. Eyes: Conjunctivae are normal. PERRL. EOMI. Head: Atraumatic. ENT:      Ears:       Nose: No congestion/rhinnorhea.      Mouth/Throat: Mucous membranes are moist.   Neck: No stridor.    Cardiovascular: Normal rate, regular rhythm. Normal S1 and S2.  Good peripheral circulation. Respiratory: Normal respiratory effort without tachypnea or retractions. Lungs CTAB. Good air entry to the bases with no decreased or absent breath sounds. Musculoskeletal: Full range of motion to all extremities. No gross deformities appreciated.  Patient is still ambulatory at this time.  No shortening or rotation of the leg.  Patient is tender over the greater trochanter.  She does have some tenderness over the sciatic notch as well.  No midline back tenderness.  Patient is able to extend, flex and internally and externally rotate the hip.  Examination of the knee is unremarkable.  Dorsalis pedis pulses sensation intact distally. Neurologic:  Normal speech and language. No gross focal neurologic deficits are appreciated.  Skin:  Skin is warm, dry and intact. No rash noted. Psychiatric: Mood and affect are normal. Speech and behavior are normal. Patient exhibits appropriate insight and judgement.   ____________________________________________   LABS (all labs ordered are listed, but only abnormal results are displayed)  Labs Reviewed - No data to display ____________________________________________  EKG   ____________________________________________  RADIOLOGY   No results found.  ____________________________________________    PROCEDURES  Procedure(s) performed:    Procedures    Medications - No data to display   ____________________________________________   INITIAL IMPRESSION / ASSESSMENT AND PLAN / ED COURSE  Pertinent labs & imaging results that were available during my care of the patient were reviewed by me and considered in my medical decision making (see chart for details).  Review of the [place] CSRS was performed in accordance of the [facility] prior to dispensing any controlled drugs.           Patient's diagnosis is consistent with bursitis,  sciatica.  Patient presents to the urgent care complaining of left hip pain..  Patient has a component of bursitis and now has some mild sciatica as well.  There was no trauma reported.  Patient is still ambulatory at this time.  I will place the patient on anti-inflammatory and muscle relaxer for symptom relief.  Follow-up primary care as needed.  Return precautions discussed with the patient..  Patient is given ED precautions to return to the ED for any worsening or new symptoms.     ____________________________________________  FINAL CLINICAL IMPRESSION(S) / DIAGNOSES  Final diagnoses:  Trochanteric bursitis of left hip  Sciatica of left side      NEW MEDICATIONS STARTED DURING THIS VISIT:  ED Discharge Orders          Ordered  meloxicam (MOBIC) 15 MG tablet  Daily        11/10/21 1808    methocarbamol (ROBAXIN) 500 MG tablet  4 times daily        11/10/21 1808                This chart was dictated using voice recognition software/Dragon. Despite best efforts to proofread, errors can occur which can change the meaning. Any change was purely unintentional.    Racheal Patches, PA-C 11/10/21 1810

## 2021-12-18 ENCOUNTER — Encounter (HOSPITAL_COMMUNITY): Payer: Self-pay

## 2021-12-18 ENCOUNTER — Other Ambulatory Visit: Payer: Self-pay

## 2021-12-18 ENCOUNTER — Ambulatory Visit (HOSPITAL_COMMUNITY)
Admission: EM | Admit: 2021-12-18 | Discharge: 2021-12-18 | Disposition: A | Discharge status: Home / Self Care | Payer: Self-pay | Attending: Internal Medicine | Admitting: Internal Medicine

## 2021-12-18 DIAGNOSIS — Z20822 Contact with and (suspected) exposure to covid-19: Secondary | ICD-10-CM | POA: Insufficient documentation

## 2021-12-18 LAB — SARS CORONAVIRUS 2 (TAT 6-24 HRS): SARS Coronavirus 2: NEGATIVE

## 2021-12-18 NOTE — ED Triage Notes (Signed)
Pt presents with need for covid test, no sx.

## 2022-03-27 ENCOUNTER — Encounter (HOSPITAL_COMMUNITY): Payer: Self-pay

## 2022-03-27 ENCOUNTER — Ambulatory Visit (HOSPITAL_COMMUNITY)
Admission: EM | Admit: 2022-03-27 | Discharge: 2022-03-27 | Disposition: A | Discharge status: Home / Self Care | Payer: Self-pay | Attending: Nurse Practitioner | Admitting: Nurse Practitioner

## 2022-03-27 DIAGNOSIS — M5442 Lumbago with sciatica, left side: Secondary | ICD-10-CM

## 2022-03-27 MED ORDER — KETOROLAC TROMETHAMINE 30 MG/ML IJ SOLN
INTRAMUSCULAR | Status: AC
  Filled 2022-03-27: qty 1

## 2022-03-27 MED ORDER — KETOROLAC TROMETHAMINE 30 MG/ML IJ SOLN
30.0000 mg | Freq: Once | INTRAMUSCULAR | Status: AC
  Administered 2022-03-27: 30 mg via INTRAMUSCULAR

## 2022-03-27 MED ORDER — NAPROXEN 500 MG PO TABS: 500.0000 mg | ORAL_TABLET | Freq: Two times a day (BID) | ORAL | 0 refills | Status: DC

## 2022-03-27 MED ORDER — TIZANIDINE HCL 4 MG PO TABS: 4.0000 mg | ORAL_TABLET | Freq: Three times a day (TID) | ORAL | 0 refills | Status: DC | PRN

## 2022-03-27 NOTE — ED Provider Notes (Signed)
?[facility] ? ? ? ?CSN: [id number] ?Arrival date & time: 03/27/22  1627 ? ? ?  ? ?History   ?Chief Complaint ?Chief Complaint  ?Patient presents with  ? Back Pain  ? ? ?HPI ?[name] is a 46 y.o. female.  ? ?Patient presents for back pain that started suddenly a couple of days ago.  Denies recent trauma, accident, or injury.  Reports the pain is 9/10 and describes the pain as sharp and shooting.  Reports the pain does radiate to the L leg below the knee.  Has used ice/heat and Tylenol for the pain with mild relief.  Denies numbness or tingling, saddle anesthesia, bowel/bladder incontinence, fevers, nausea/vomiting, and dysuria/urinary frequency.  ? ?Patient reports he works at [employer] for living-lifts a lot of heavy boxes. ? ? ?Past Medical History:  ?Diagnosis Date  ? Medical history non-contributory   ? ? ?There are no problems to display for this patient. ? ? ?Past Surgical History:  ?Procedure Laterality Date  ? TUBAL LIGATION    ? ? ?OB History   ? ? Gravida  ?2  ? Para  ?2  ? Term  ?2  ? Preterm  ?   ? AB  ?   ? Living  ?2  ?  ? ? SAB  ?   ? IAB  ?   ? Ectopic  ?   ? Multiple  ?   ? Live Births  ?2  ?   ?  ?  ? ? ? ?Home Medications   ? ?Prior to Admission medications   ?Medication Sig Start Date End Date Taking? Authorizing Provider  ?naproxen (NAPROSYN) 500 MG tablet Take 1 tablet (500 mg total) by mouth 2 (two) times daily. Take with food to prevent GI upset 03/27/22  Yes Valentino Nose, NP  ?tiZANidine (ZANAFLEX) 4 MG tablet Take 1 tablet (4 mg total) by mouth every 8 (eight) hours as needed for muscle spasms. Do not take while driving or operating heavy machinery 03/27/22  Yes Valentino Nose, NP  ? ? ?Family History ?Family History  ?Family history unknown: Yes  ? ? ?Social History ?Social History  ? ?Tobacco Use  ? Smoking status: Every Day  ?  Packs/day: 0.50  ?  Types: Cigarettes  ? Smokeless tobacco: Never  ?Vaping Use  ? Vaping Use: Never used  ?Substance Use Topics  ?  Alcohol use: Yes  ?  Comment: occ  ? Drug use: Yes  ?  Types: Marijuana  ? ? ? ?Allergies   ?Patient has no known allergies. ? ? ?Review of Systems ?Review of Systems ?Per HPI ? ?Physical Exam ?Triage Vital Signs ?ED Triage Vitals  ?Enc Vitals Group  ?   BP 03/27/22 1705 119/79  ?   Pulse Rate 03/27/22 1705 78  ?   Resp 03/27/22 1705 18  ?   Temp 03/27/22 1705 98.3 ?F (36.8 ?C)  ?   Temp Source 03/27/22 1705 Oral  ?   SpO2 03/27/22 1705 99 %  ?   Weight --   ?   Height --   ?   Head Circumference --   ?   Peak Flow --   ?   Pain Score 03/27/22 1707 9  ?   Pain Loc --   ?   Pain Edu? --   ?   Excl. in GC? --   ? ?No data found. ? ?Updated Vital Signs ?BP 119/79 (BP Location: Right Arm)  Pulse 78   Temp 98.3 ?F (36.8 ?C) (Oral)   Resp 18   LMP 03/18/2022 (Exact Date)   SpO2 99%  ? ?Visual Acuity ?Right Eye Distance:   ?Left Eye Distance:   ?Bilateral Distance:   ? ?Right Eye Near:   ?Left Eye Near:    ?Bilateral Near:    ? ?Physical Exam ?Vitals and nursing note reviewed.  ?Constitutional:   ?   General: She is not in acute distress. ?   Appearance: Normal appearance. She is not toxic-appearing.  ?HENT:  ?   Mouth/Throat:  ?   Mouth: Mucous membranes are moist.  ?   Pharynx: Oropharynx is clear.  ?Pulmonary:  ?   Effort: Pulmonary effort is normal. No respiratory distress.  ?Musculoskeletal:  ?   Lumbar back: Spasms present. No swelling, deformity, tenderness or bony tenderness. Normal range of motion. Positive left straight leg raise test. Negative right straight leg raise test.  ?     Back: ? ?   Right lower leg: No edema.  ?   Left lower leg: No edema.  ?   Comments: Tender to palpation in the area marked; no obvious deformity, swelling, erythema  ?Skin: ?   General: Skin is warm and dry.  ?   Capillary Refill: Capillary refill takes less than 2 seconds.  ?   Coloration: Skin is not jaundiced or pale.  ?   Findings: No erythema.  ?Neurological:  ?   Mental Status: She is alert and oriented to person, place,  and time.  ?   Motor: No weakness.  ?Psychiatric:     ?   Behavior: Behavior is cooperative.  ? ? ? ?UC Treatments / Results  ?Labs ?(all labs ordered are listed, but only abnormal results are displayed) ?Labs Reviewed - No data to display ? ?EKG ? ? ?Radiology ?No results found. ? ?Procedures ?Procedures (including critical care time) ? ?Medications Ordered in UC ?Medications  ?ketorolac (TORADOL) 30 MG/ML injection 30 mg (has no administration in time range)  ? ? ?Initial Impression / Assessment and Plan / UC Course  ?I have reviewed the triage vital signs and the nursing notes. ? ?Pertinent labs & imaging results that were available during my care of the patient were reviewed by me and considered in my medical decision making (see chart for details). ? ?  ?Patient's exam and history consistent with left-sided sciatica.  No red flags on history or on examination today.  Treat with anti-inflammatories and muscle relaxant.  Toradol 30 mg IM given today in urgent care.  Can use Tylenol the rest of the evening, however strongly encouraged to not use any NSAIDs.  Starting tomorrow, she can alternate Tylenol and naproxen for pain.  We will also start tizanidine 4 mg at nighttime for muscle pain.  Encouraged follow-up with sports medicine for rehabilitation for recurrent back pain.  Note given for work.  The patient was given the opportunity to ask questions.  All questions answered to their satisfaction.  The patient is in agreement to this plan.  ? ?Final Clinical Impressions(s) / UC Diagnoses  ? ?Final diagnoses:  ?Acute left-sided low back pain with left-sided sciatica  ? ? ? ?Discharge Instructions   ? ?  ?- We have given you a shot of Toradol today which is an anti-inflammatory.  Please do not take any other anti-inflammatories today.  You can use Tylenol, heat/ice, and the muscle relaxant today. ?-Starting tomorrow, please begin alternating naproxen and Tylenol.  You  can continue to use muscle relaxant as needed  at bedtime for pain. ?- Please follow up with Sports Medicine for the recurrent sciatica pain that you are having ? ? ? ?ED Prescriptions   ? ? Medication Sig Dispense Auth. Provider  ? naproxen (NAPROSYN) 500 MG tablet Take 1 tablet (500 mg total) by mouth 2 (two) times daily. Take with food to prevent GI upset 30 tablet Cathlean MarseillesMartinez, [place] Chagnon A, NP  ? tiZANidine (ZANAFLEX) 4 MG tablet Take 1 tablet (4 mg total) by mouth every 8 (eight) hours as needed for muscle spasms. Do not take while driving or operating heavy machinery 30 tablet Valentino NoseMartinez, Red Mandt A, NP  ? ?  ? ?PDMP not reviewed this encounter. ?  ?Valentino NoseMartinez, Keimari [place] A, NP ?03/27/22 1739 ? ?

## 2022-03-27 NOTE — Discharge Instructions (Addendum)
-   We have given you a shot of Toradol today which is an anti-inflammatory.  Please do not take any other anti-inflammatories today.  You can use Tylenol, heat/ice, and the muscle relaxant today. ?-Starting tomorrow, please begin alternating naproxen and Tylenol.  You can continue to use muscle relaxant as needed at bedtime for pain. ?- Please follow up with Sports Medicine for the recurrent sciatica pain that you are having ?

## 2022-03-27 NOTE — ED Triage Notes (Signed)
Pt states left lower back pain radiating down left leg for the past 2 days. Has been taking tylenol with no relief. ?

## 2022-09-24 ENCOUNTER — Ambulatory Visit
Admission: EM | Admit: 2022-09-24 | Discharge: 2022-09-24 | Disposition: A | Discharge status: Home / Self Care | Payer: BC Managed Care – PPO | Attending: Emergency Medicine | Admitting: Emergency Medicine

## 2022-09-24 ENCOUNTER — Encounter: Payer: Self-pay | Admitting: Emergency Medicine

## 2022-09-24 DIAGNOSIS — G43809 Other migraine, not intractable, without status migrainosus: Secondary | ICD-10-CM

## 2022-09-24 MED ORDER — KETOROLAC TROMETHAMINE 60 MG/2ML IM SOLN
30.0000 mg | Freq: Once | INTRAMUSCULAR | Status: AC
  Administered 2022-09-24: 30 mg via INTRAMUSCULAR

## 2022-09-24 MED ORDER — BUTALBITAL-APAP-CAFFEINE 50-325-40 MG PO TABS: 1.0000 | ORAL_TABLET | ORAL | 0 refills | Status: AC | PRN

## 2022-09-24 MED ORDER — DEXAMETHASONE SODIUM PHOSPHATE 10 MG/ML IJ SOLN
10.0000 mg | Freq: Once | INTRAMUSCULAR | Status: AC
  Administered 2022-09-24: 10 mg via INTRAMUSCULAR

## 2022-09-24 MED ORDER — IBUPROFEN 600 MG PO TABS: 600.0000 mg | ORAL_TABLET | Freq: Four times a day (QID) | ORAL | 0 refills | Status: DC | PRN

## 2022-09-24 MED ORDER — ONDANSETRON 4 MG PO TBDP
4.0000 mg | ORAL_TABLET | Freq: Once | ORAL | Status: AC
  Administered 2022-09-24: 4 mg via ORAL

## 2022-09-24 MED ORDER — ONDANSETRON 4 MG PO TBDP: 4.0000 mg | ORAL_TABLET | Freq: Three times a day (TID) | ORAL | 0 refills | Status: DC | PRN

## 2022-09-24 NOTE — Discharge Instructions (Addendum)
Take 600 mg of ibuprofen and a Tylenol containing product 3 times a day.  You can take either the ibuprofen with 1000 mg of Tylenol or the ibuprofen with 1-2 Fioricet.  Do not take more than 6 capsules of Fioricet in 24 hours.  Make sure you push plenty of electrolyte containing fluids such as Pedialyte or Gatorade, get as much rest as you possibly can.  Take the Zofran also when your headache is bothering you.  This has been shown to reduce migraines.  Below is a list of primary care practices who are taking new patients for you to follow-up with.  [facility] -multiple locations.  See website at [email or web address]  [facility] internal medicine clinic Ground Floor - [facility], [place], [place], [place] [address] [phone]  [facility] at [facility] [address] [place], [place] [address] [phone]  [facility]- will see patients with no insurance [address] [place], [place] [address] [place], [place] [address] [phone]  [facility] [phone] [place] [address]  [facility]: [place]  [phone]  [facility]: [address] [place]  [phone]  [facility] [facility] : [address] [place] [phone]  [facility]: [address] [place] [phone]  [facility]: [place] [phone]  Dr. Blanchie Serve [phone] [place]  [facility]- will see patients with no insurance. [address] [place], [place] [address] [phone]  Go to www.goodrx.com  or www.costplusdrugs.com to look up your medications. This will give you a list  of where you can find your prescriptions at the most affordable prices. Or ask the pharmacist what the cash price is, or if they have any other discount programs available to help make your medication more affordable. This can be less expensive than what you would pay with insurance.

## 2022-09-24 NOTE — ED Triage Notes (Signed)
Pt presents to uc with co of ha and right eye socket pain for 3 days. Pt reports throbbing and comes and goes, has been taking BC with minimal improvement. Pt repots loud noises make it worse. Pt reports need for work note as she has been out of work for the ha.

## 2022-09-24 NOTE — ED Provider Notes (Signed)
HPI  SUBJECTIVE:  [name] is a 46 y.o. female who reports a gradual onset, right-sided, throbbing, constant headache located behind her eye starting last night.  She reports photophobia, phonophobia.  No fevers, nausea, vomiting, ear pain, jaw or dental pain.  No nasal congestion, rhinorrhea, sinus pain or pressure, facial droop, arm or leg weakness, slurred speech, discoordination, visual changes, seizures, syncope.  She has had headaches like this before, but has no formal diagnosis of migraines.  She does not get headaches frequently.  She tried [facility] powders without improvement in her symptoms.  No alleviating factors.  Symptoms are worse with loud noise, light.  She has a past medical history of headaches.  No history of diabetes, hypertension, atrial fibrillation, CVA, aneurysm, HIV.  She is status post bilateral tubal ligation.  Family history negative for CVA, aneurysm.  LMP: 2 weeks ago.  PCP: None.    Past Medical History:  Diagnosis Date   Medical history non-contributory     Past Surgical History:  Procedure Laterality Date   TUBAL LIGATION      Family History  Family history unknown: Yes    Social History   Tobacco Use   Smoking status: Every Day    Packs/day: 0.50    Types: Cigarettes   Smokeless tobacco: Never  Vaping Use   Vaping Use: Never used  Substance Use Topics   Alcohol use: Yes    Comment: occ   Drug use: Yes    Types: Marijuana    No current facility-administered medications for this encounter.  Current Outpatient Medications:    butalbital-acetaminophen-caffeine (FIORICET) 50-325-40 MG tablet, Take 1-2 tablets by mouth every 4 (four) hours as needed for headache. Max 6 caps/day, Disp: 20 tablet, Rfl: 0   ibuprofen (ADVIL) 600 MG tablet, Take 1 tablet (600 mg total) by mouth every 6 (six) hours as needed., Disp: 30 tablet, Rfl: 0   ondansetron (ZOFRAN-ODT) 4 MG disintegrating tablet, Take 1 tablet (4 mg total) by mouth every 8 (eight)  hours as needed for nausea or vomiting., Disp: 20 tablet, Rfl: 0  No Known Allergies   ROS  As noted in HPI.   Physical Exam  BP 120/81   Pulse 93   Temp 98.2 F (36.8 C)   Resp 18   LMP 09/10/2022 Comment: hyterectomy  SpO2 99%   Constitutional: Well developed, well nourished, no acute distress Eyes: PERRL, EOMI, conjunctiva normal bilaterally.  Mild photophobia. HENT: Normocephalic, atraumatic,mucus membranes moist, normal dentition.  TM normal b/l. No TMJ tenderness. No nasal congestion, no sinus tenderness. No temporal artery tenderness.  Neck: no cervical LN.  No trapezial muscle tenderness. No meningismus Respiratory: normal inspiratory effort Cardiovascular: Normal rate, regular rhythm GI:  nondistended skin: No rash, skin intact Musculoskeletal: No edema, no tenderness, no deformities Neurologic: Alert & oriented x 3, CN III-XII intact, romberg neg, finger-> nose, heel-> shin equal b/l, Romberg neg, tandem gait steady Psychiatric: Speech and behavior appropriate   ED Course \  Medications  ketorolac (TORADOL) injection 30 mg (30 mg Intramuscular Given 09/24/22 1637)  dexamethasone (DECADRON) injection 10 mg (10 mg Intramuscular Given 09/24/22 1637)  ondansetron (ZOFRAN-ODT) disintegrating tablet 4 mg (4 mg Oral Given 09/24/22 1637)    Orders Placed This Encounter  Procedures   Nursing Communication Please set up patient with PCP prior to discharge    Please set up patient with PCP prior to discharge    Standing Status:   Standing    Number of Occurrences:  1   No results found for this or any previous visit (from the past 24 hour(s)). No results found.   ED Clinical Impression  1. Other migraine without status migrainosus, not intractable     ED Assessment/Plan     Presentation consistent with a migraine headache.  Pt describing typical pain, no sudden onset. Doubt SAH, ICH or space occupying lesion. Pt without fevers/chills, Pt has no  meningeal sx, no nuchal rigidity. Doubt meningitis. Pt with normal neuro exam, no evidence of CVA/TIA.  Pt BP not elevated significantly, doubt hypertensive emergency. No evidence of temporal artery tenderness, no evidence of glaucoma or other ocular pathology. Will give headache cocktail (dexamethasone 10 IM, zofran 4 po, toradol 30 IM),  and reassess.  [place] controlled substances registry for this patient consulted and feel the risk/benefit ratio today is favorable for proceeding with this prescription for a controlled substance.  No opiate prescriptions in the past 2 years.  Pt much improved after medications. Pt with continued non-focal neuro exam. Will d/c home with 600 mg ibuprofen combined with 1000 mg Tylenol 3 times a day, Fioricet 1 to 2 tablets every 4 hours as needed, Zofran 4 mg ODT 3 times daily as needed,  and have pt F/U with PCP of choice.  Will provide primary care list..  Discussed MDM, plan for follow up, signs and sx that should prompt return to ER. Pt agrees with plan  Meds ordered this encounter  Medications   ketorolac (TORADOL) injection 30 mg   dexamethasone (DECADRON) injection 10 mg   ondansetron (ZOFRAN-ODT) disintegrating tablet 4 mg   ibuprofen (ADVIL) 600 MG tablet    Sig: Take 1 tablet (600 mg total) by mouth every 6 (six) hours as needed.    Dispense:  30 tablet    Refill:  0   butalbital-acetaminophen-caffeine (FIORICET) 50-325-40 MG tablet    Sig: Take 1-2 tablets by mouth every 4 (four) hours as needed for headache. Max 6 caps/day    Dispense:  20 tablet    Refill:  0   ondansetron (ZOFRAN-ODT) 4 MG disintegrating tablet    Sig: Take 1 tablet (4 mg total) by mouth every 8 (eight) hours as needed for nausea or vomiting.    Dispense:  20 tablet    Refill:  0    *This clinic note was created using Scientist, clinical (histocompatibility and immunogenetics). Therefore, there may be occasional mistakes despite careful proofreading.  ?    Domenick Gong, MD 09/24/22 1720

## 2022-12-09 ENCOUNTER — Ambulatory Visit: Payer: BC Managed Care – PPO | Admitting: Family

## 2022-12-13 ENCOUNTER — Ambulatory Visit: Payer: BC Managed Care – PPO | Admitting: Family

## 2022-12-25 ENCOUNTER — Ambulatory Visit: Payer: BC Managed Care – PPO | Admitting: Family

## 2023-06-29 ENCOUNTER — Emergency Department (HOSPITAL_COMMUNITY): Payer: Self-pay

## 2023-06-29 ENCOUNTER — Emergency Department (HOSPITAL_COMMUNITY)
Admission: EM | Admit: 2023-06-29 | Discharge: 2023-06-30 | Disposition: A | Discharge status: Home / Self Care | Payer: Self-pay | Attending: Emergency Medicine | Admitting: Emergency Medicine

## 2023-06-29 ENCOUNTER — Encounter (HOSPITAL_COMMUNITY): Payer: Self-pay

## 2023-06-29 ENCOUNTER — Other Ambulatory Visit: Payer: Self-pay

## 2023-06-29 DIAGNOSIS — T148XXA Other injury of unspecified body region, initial encounter: Secondary | ICD-10-CM

## 2023-06-29 DIAGNOSIS — Z23 Encounter for immunization: Secondary | ICD-10-CM | POA: Insufficient documentation

## 2023-06-29 DIAGNOSIS — W52XXXA Crushed, pushed or stepped on by crowd or human stampede, initial encounter: Secondary | ICD-10-CM | POA: Insufficient documentation

## 2023-06-29 DIAGNOSIS — S0081XA Abrasion of other part of head, initial encounter: Secondary | ICD-10-CM | POA: Insufficient documentation

## 2023-06-29 DIAGNOSIS — S60511A Abrasion of right hand, initial encounter: Secondary | ICD-10-CM | POA: Insufficient documentation

## 2023-06-29 DIAGNOSIS — T07XXXA Unspecified multiple injuries, initial encounter: Secondary | ICD-10-CM

## 2023-06-29 DIAGNOSIS — S80212A Abrasion, left knee, initial encounter: Secondary | ICD-10-CM | POA: Insufficient documentation

## 2023-06-29 MED ORDER — TETRACAINE HCL 0.5 % OP SOLN
1.0000 [drp] | Freq: Once | OPHTHALMIC | Status: AC
  Administered 2023-06-30: 1 [drp] via OPHTHALMIC
  Filled 2023-06-29: qty 4

## 2023-06-29 MED ORDER — FLUORESCEIN SODIUM 1 MG OP STRP
1.0000 | ORAL_STRIP | Freq: Once | OPHTHALMIC | Status: AC
  Administered 2023-06-30 (×2): 1 via OPHTHALMIC
  Filled 2023-06-29: qty 1

## 2023-06-29 MED ORDER — OXYCODONE-ACETAMINOPHEN 5-325 MG PO TABS
1.0000 | ORAL_TABLET | Freq: Once | ORAL | Status: AC
  Administered 2023-06-30: 1 via ORAL
  Filled 2023-06-29: qty 1

## 2023-06-29 MED ORDER — TETANUS-DIPHTH-ACELL PERTUSSIS 5-2.5-18.5 LF-MCG/0.5 IM SUSY
0.5000 mL | PREFILLED_SYRINGE | Freq: Once | INTRAMUSCULAR | Status: AC
  Administered 2023-06-30: 0.5 mL via INTRAMUSCULAR
  Filled 2023-06-29: qty 0.5
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
## 2023-06-30 MED ORDER — LIDOCAINE-EPINEPHRINE (PF) 2 %-1:200000 IJ SOLN
10.0000 mL | Freq: Once | INTRAMUSCULAR | Status: AC
  Administered 2023-06-30: 10 mL
  Filled 2023-06-30: qty 20

## 2023-06-30 NOTE — Discharge Instructions (Addendum)
Avoid soaking your wound in stagnant or dirty water such as while taking a bath. You can shower normally. Keep the area clean with mild soap and warm water. Do not apply peroxide or alcohol to your wound as this can break down newly forming skin and prolong wound healing. If you keep the area bandaged, change the dressing/bandage at least once per day. Silicone scar gel may help to minimize scarring/wound defect. We do recommend follow up with a plastic surgeon to ensure optimal healing.

## 2024-01-03 ENCOUNTER — Encounter (HOSPITAL_COMMUNITY): Payer: Self-pay

## 2024-01-03 ENCOUNTER — Ambulatory Visit (HOSPITAL_COMMUNITY)
Admission: EM | Admit: 2024-01-03 | Discharge: 2024-01-03 | Disposition: A | Discharge status: Home / Self Care | Payer: Self-pay | Attending: Emergency Medicine | Admitting: Emergency Medicine

## 2024-01-03 DIAGNOSIS — R519 Headache, unspecified: Secondary | ICD-10-CM

## 2024-01-03 DIAGNOSIS — B349 Viral infection, unspecified: Secondary | ICD-10-CM

## 2024-01-03 MED ORDER — KETOROLAC TROMETHAMINE 30 MG/ML IJ SOLN
INTRAMUSCULAR | Status: AC
  Filled 2024-01-03: qty 1

## 2024-01-03 MED ORDER — KETOROLAC TROMETHAMINE 60 MG/2ML IM SOLN
30.0000 mg | Freq: Once | INTRAMUSCULAR | Status: AC
  Administered 2024-01-03: 30 mg via INTRAMUSCULAR

## 2024-01-03 NOTE — ED Triage Notes (Signed)
Patient here today with c/o headache, sensitivity to light since yesterday. She tried taking BC powder with no relief. Patient states that when she was in the lobby, she bent over and stood up and saw spots.  Patient has also been having left ear pain today when she was using a q-tip to clean her ears today. Patient states that she is having some difficulty hearing.

## 2024-01-03 NOTE — Discharge Instructions (Addendum)
We have given you a Toradol injection in clinic to help with your bad headache.  If you have any headache, aches or pains today you can take 500 mg of Tylenol every 8 hours.  Starting tomorrow you can alternate between 500 mg of Tylenol and 800 mg of ibuprofen every 4-6 hours.  Sure you are getting plenty of rest and drinking at least 64 ounces of water daily, I suspect your symptoms are due to a viral illness that should improve over the next 5 to 7 days.  Return to clinic for any new or urgent symptoms.

## 2024-01-03 NOTE — ED Provider Notes (Signed)
[facility]    CSN: [id number] Arrival date & time: 01/03/24  1323      History   Chief Complaint Chief Complaint  Patient presents with   Headache   Otalgia    HPI [name] is a 48 y.o. female.   Patient presents to clinic for complaints of a headache and light sensitivity that started yesterday.  She noticed when she was cleaning out her left ear with a Q-tip that it was painful.  She does not really drink water, when she was out in the lobby bending over she stood up and saw spots.  Has not had any syncope or loss of consciousness. No vision changes or weakness.   Her daughter works at the hospital, reports she is unsure if she has brought something home.  Denies any fevers.  She has a chronic cough from smoking.  No nausea, vomiting or diarrhea.  Has not tried any medications or interventions for her headache or ear pain.  The history is provided by the patient and medical records.  Headache Otalgia   Past Medical History:  Diagnosis Date   Medical history non-contributory     There are no active problems to display for this patient.   Past Surgical History:  Procedure Laterality Date   TUBAL LIGATION      OB History     Gravida  2   Para  2   Term  2   Preterm      AB      Living  2      SAB      IAB      Ectopic      Multiple      Live Births  2            Home Medications    Prior to Admission medications   Medication Sig Start Date End Date Taking? Authorizing Provider  butalbital-acetaminophen-caffeine (FIORICET) 50-325-40 MG tablet Take 1-2 tablets by mouth every 4 (four) hours as needed for headache. Max 6 caps/day Patient not taking: Reported on 01/03/2024 09/24/22   Domenick Gong, MD    Family History Family History  Family history unknown: Yes    Social History Social History   Tobacco Use   Smoking status: Every Day    Current packs/day: 0.50    Types: Cigarettes   Smokeless tobacco:  Never  Vaping Use   Vaping status: Never Used  Substance Use Topics   Alcohol use: Yes    Comment: occ   Drug use: Yes    Types: Marijuana     Allergies   Patient has no known allergies.   Review of Systems Review of Systems  Per HPI   Physical Exam Triage Vital Signs ED Triage Vitals  Encounter Vitals Group     BP 01/03/24 1454 105/68     Systolic BP Percentile --      Diastolic BP Percentile --      Pulse Rate 01/03/24 1454 82     Resp 01/03/24 1454 16     Temp 01/03/24 1454 98.1 F (36.7 C)     Temp Source 01/03/24 1454 Oral     SpO2 01/03/24 1454 98 %     Weight 01/03/24 1454 150 lb (68 kg)     Height 01/03/24 1454 5\' 2"  (1.575 m)     Head Circumference --      Peak Flow --      Pain Score 01/03/24 1455 8  Pain Loc --      Pain Education --      Exclude from Growth Chart --    No data found.  Updated Vital Signs BP 105/68 (BP Location: Right Arm)   Pulse 82   Temp 98.1 F (36.7 C) (Oral)   Resp 16   Ht 5\' 2"  (1.575 m)   Wt 150 lb (68 kg)   LMP 12/25/2023 (Approximate)   SpO2 98%   BMI 27.44 kg/m   Visual Acuity Right Eye Distance:   Left Eye Distance:   Bilateral Distance:    Right Eye Near:   Left Eye Near:    Bilateral Near:     Physical Exam Vitals and nursing note reviewed.  Constitutional:      Appearance: Normal appearance. She is well-developed.  HENT:     Head: Normocephalic and atraumatic.     Right Ear: Ear canal and external ear normal. A middle ear effusion is present.     Left Ear: Ear canal and external ear normal. A middle ear effusion is present.     Nose: Nose normal.     Mouth/Throat:     Mouth: Mucous membranes are moist.  Eyes:     Extraocular Movements: Extraocular movements intact.     Pupils: Pupils are equal, round, and reactive to light.  Cardiovascular:     Rate and Rhythm: Normal rate and regular rhythm.     Heart sounds: Normal heart sounds. No murmur heard. Pulmonary:     Effort: Pulmonary effort  is normal. No respiratory distress.     Breath sounds: Normal breath sounds.  Musculoskeletal:        General: Normal range of motion.  Skin:    General: Skin is warm and dry.  Neurological:     General: No focal deficit present.     Mental Status: She is alert and oriented to person, place, and time.     GCS: GCS eye subscore is 4. GCS verbal subscore is 5. GCS motor subscore is 6.  Psychiatric:        Mood and Affect: Mood normal.        Behavior: Behavior normal. Behavior is cooperative.      UC Treatments / Results  Labs (all labs ordered are listed, but only abnormal results are displayed) Labs Reviewed - No data to display  EKG   Radiology No results found.  Procedures Procedures (including critical care time)  Medications Ordered in UC Medications  ketorolac (TORADOL) injection 30 mg (30 mg Intramuscular Given 01/03/24 1518)    Initial Impression / Assessment and Plan / UC Course  I have reviewed the triage vital signs and the nursing notes.  Pertinent labs & imaging results that were available during my care of the patient were reviewed by me and considered in my medical decision making (see chart for details).  Vitals and triage reviewed, patient is hemodynamically stable.  PERRLA, lungs are vesicular, heart with regular rate and rhythm.  Bilateral middle ear effusions, no obvious infection.  Does have some external auditory canal irritation, suspect from Q-tip use.  No cerumen or impaction.  Headache may be due to dehydration or viral illness, symptomatic management encouraged.  IM Toradol given in clinic to help with pain.  Plan of care, follow-up care and return precautions given, no questions at this time.  Work note provided.     Final Clinical Impressions(s) / UC Diagnoses   Final diagnoses:  Bad headache  Viral illness  Discharge Instructions      We have given you a Toradol injection in clinic to help with your bad headache.  If you have any  headache, aches or pains today you can take 500 mg of Tylenol every 8 hours.  Starting tomorrow you can alternate between 500 mg of Tylenol and 800 mg of ibuprofen every 4-6 hours.  Sure you are getting plenty of rest and drinking at least 64 ounces of water daily, I suspect your symptoms are due to a viral illness that should improve over the next 5 to 7 days.  Return to clinic for any new or urgent symptoms.    ED Prescriptions   None    PDMP not reviewed this encounter.   Ecko Beasley, [place], [place] 01/03/24 [phone]
